# Patient Record
Sex: Female | Born: 1955
Health system: Southern US, Community
[De-identification: ages and names within clinical notes are randomized; demographics above are authoritative.]

## PROBLEM LIST (undated history)

## (undated) DIAGNOSIS — C801 Malignant (primary) neoplasm, unspecified: Secondary | ICD-10-CM

## (undated) DIAGNOSIS — M1990 Unspecified osteoarthritis, unspecified site: Secondary | ICD-10-CM

## (undated) DIAGNOSIS — Z111 Encounter for screening for respiratory tuberculosis: Secondary | ICD-10-CM

## (undated) DIAGNOSIS — M8580 Other specified disorders of bone density and structure, unspecified site: Secondary | ICD-10-CM

## (undated) HISTORY — DX: Other specified disorders of bone density and structure, unspecified site: M85.80

## (undated) HISTORY — DX: Unspecified osteoarthritis, unspecified site: M19.90

## (undated) HISTORY — PX: BREAST REDUCTION SURGERY: SHX8

## (undated) HISTORY — DX: Encounter for screening for respiratory tuberculosis: Z11.1

## (undated) HISTORY — DX: Malignant (primary) neoplasm, unspecified: C80.1

## (undated) HISTORY — PX: OTHER SURGICAL HISTORY: SHX169

---

## 1997-02-23 HISTORY — PX: COLONOSCOPY: SHX174

## 1998-05-08 ENCOUNTER — Encounter: Payer: Self-pay | Admitting: Internal Medicine

## 1998-10-21 ENCOUNTER — Other Ambulatory Visit: Admission: RE | Admit: 1998-10-21 | Discharge: 1998-10-21 | Payer: Self-pay | Admitting: Obstetrics and Gynecology

## 2000-02-06 ENCOUNTER — Other Ambulatory Visit: Admission: RE | Admit: 2000-02-06 | Discharge: 2000-02-06 | Payer: Self-pay | Admitting: *Deleted

## 2002-02-23 HISTORY — PX: REFRACTIVE SURGERY: SHX103

## 2002-04-24 ENCOUNTER — Encounter: Payer: Self-pay | Admitting: Internal Medicine

## 2002-05-03 ENCOUNTER — Other Ambulatory Visit: Admission: RE | Admit: 2002-05-03 | Discharge: 2002-05-03 | Payer: Self-pay | Admitting: Internal Medicine

## 2002-11-02 ENCOUNTER — Encounter: Admission: RE | Admit: 2002-11-02 | Discharge: 2002-11-02 | Payer: Self-pay | Admitting: Internal Medicine

## 2002-11-02 ENCOUNTER — Encounter: Payer: Self-pay | Admitting: Internal Medicine

## 2003-02-24 ENCOUNTER — Emergency Department (HOSPITAL_COMMUNITY): Admission: AD | Admit: 2003-02-24 | Discharge: 2003-02-24 | Payer: Self-pay | Admitting: Family Medicine

## 2004-11-06 ENCOUNTER — Encounter: Admission: RE | Admit: 2004-11-06 | Discharge: 2004-11-06 | Payer: Self-pay | Admitting: Internal Medicine

## 2005-03-04 ENCOUNTER — Other Ambulatory Visit: Admission: RE | Admit: 2005-03-04 | Discharge: 2005-03-04 | Payer: Self-pay | Admitting: Obstetrics and Gynecology

## 2005-04-10 ENCOUNTER — Encounter: Admission: RE | Admit: 2005-04-10 | Discharge: 2005-04-10 | Payer: Self-pay | Admitting: Obstetrics and Gynecology

## 2005-06-27 ENCOUNTER — Ambulatory Visit: Payer: Self-pay | Admitting: Family Medicine

## 2006-07-15 ENCOUNTER — Ambulatory Visit: Payer: Self-pay | Admitting: Family Medicine

## 2006-07-15 LAB — CONVERTED CEMR LAB
Bilirubin Urine: NEGATIVE
Ketones, ur: NEGATIVE mg/dL
Nitrite: NEGATIVE
Specific Gravity, Urine: >=1.03 (ref 1.000–1.03)
Urine Glucose: NEGATIVE mg/dL
Urobilinogen, UA: 0.2 (ref 0.0–1.0)

## 2006-07-22 ENCOUNTER — Ambulatory Visit: Payer: Self-pay | Admitting: Internal Medicine

## 2006-09-23 ENCOUNTER — Telehealth: Payer: Self-pay | Admitting: Internal Medicine

## 2006-10-21 ENCOUNTER — Encounter: Payer: Self-pay | Admitting: Internal Medicine

## 2006-10-28 ENCOUNTER — Encounter: Payer: Self-pay | Admitting: Internal Medicine

## 2006-11-04 ENCOUNTER — Ambulatory Visit: Payer: Self-pay | Admitting: Family Medicine

## 2006-11-04 ENCOUNTER — Encounter: Admission: RE | Admit: 2006-11-04 | Discharge: 2006-11-30 | Payer: Self-pay | Admitting: Neurological Surgery

## 2006-11-04 LAB — CONVERTED CEMR LAB
ALT: 21 units/L (ref 0–35)
AST: 25 units/L (ref 0–37)
Alkaline Phosphatase: 55 units/L (ref 39–117)
BUN: 13 mg/dL (ref 6–23)
Basophils Relative: 0.7 % (ref 0.0–1.0)
CO2: 30 meq/L (ref 19–32)
Calcium: 9.4 mg/dL (ref 8.4–10.5)
Chloride: 105 meq/L (ref 96–112)
Creatinine, Ser: 0.9 mg/dL (ref 0.4–1.2)
Crystals: NEGATIVE
Eosinophils Relative: 2.9 % (ref 0.0–5.0)
GFR calc Af Amer: 85 mL/min
HCT: 34.5 % — ABNORMAL LOW (ref 36.0–46.0)
HDL: 63.8 mg/dL (ref >39.0)
Lymphocytes Relative: 30.9 % (ref 12.0–46.0)
Mucus, UA: NEGATIVE
Neutrophils Relative %: 56.2 % (ref 43.0–77.0)
Platelets: 193 10*3/uL (ref 150–400)
RBC: 3.82 M/uL — ABNORMAL LOW (ref 3.87–5.11)
Total Bilirubin: 0.6 mg/dL (ref 0.3–1.2)
Total Protein, Urine: NEGATIVE mg/dL
Total Protein: 7 g/dL (ref 6.0–8.3)
VLDL: 12 mg/dL (ref 0–40)
WBC: 4.7 10*3/uL (ref 4.5–10.5)

## 2006-11-16 ENCOUNTER — Encounter: Payer: Self-pay | Admitting: Internal Medicine

## 2006-11-16 ENCOUNTER — Ambulatory Visit: Payer: Self-pay | Admitting: Internal Medicine

## 2006-11-16 ENCOUNTER — Other Ambulatory Visit: Admission: RE | Admit: 2006-11-16 | Discharge: 2006-11-16 | Payer: Self-pay | Admitting: Internal Medicine

## 2006-11-16 DIAGNOSIS — J309 Allergic rhinitis, unspecified: Secondary | ICD-10-CM | POA: Insufficient documentation

## 2006-11-16 DIAGNOSIS — M899 Disorder of bone, unspecified: Secondary | ICD-10-CM | POA: Insufficient documentation

## 2006-11-16 DIAGNOSIS — Z9189 Other specified personal risk factors, not elsewhere classified: Secondary | ICD-10-CM | POA: Insufficient documentation

## 2006-11-16 DIAGNOSIS — M1990 Unspecified osteoarthritis, unspecified site: Secondary | ICD-10-CM | POA: Insufficient documentation

## 2006-11-16 DIAGNOSIS — M949 Disorder of cartilage, unspecified: Secondary | ICD-10-CM

## 2006-11-22 ENCOUNTER — Ambulatory Visit: Payer: Self-pay | Admitting: Internal Medicine

## 2006-11-26 ENCOUNTER — Ambulatory Visit: Payer: Self-pay | Admitting: Internal Medicine

## 2006-11-26 ENCOUNTER — Encounter: Payer: Self-pay | Admitting: Internal Medicine

## 2006-11-28 ENCOUNTER — Encounter: Payer: Self-pay | Admitting: Internal Medicine

## 2006-11-29 ENCOUNTER — Encounter: Payer: Self-pay | Admitting: Internal Medicine

## 2007-01-03 ENCOUNTER — Telehealth: Payer: Self-pay | Admitting: Internal Medicine

## 2007-02-24 LAB — HM COLONOSCOPY

## 2007-03-14 ENCOUNTER — Encounter: Admission: RE | Admit: 2007-03-14 | Discharge: 2007-03-14 | Payer: Self-pay | Admitting: Internal Medicine

## 2007-08-10 ENCOUNTER — Telehealth: Payer: Self-pay | Admitting: Internal Medicine

## 2007-11-26 ENCOUNTER — Telehealth: Payer: Self-pay | Admitting: Family Medicine

## 2007-11-28 ENCOUNTER — Telehealth: Payer: Self-pay | Admitting: Internal Medicine

## 2009-03-22 ENCOUNTER — Telehealth: Payer: Self-pay | Admitting: Internal Medicine

## 2009-03-22 ENCOUNTER — Ambulatory Visit: Payer: Self-pay | Admitting: Internal Medicine

## 2009-03-22 DIAGNOSIS — R300 Dysuria: Secondary | ICD-10-CM | POA: Insufficient documentation

## 2009-03-22 LAB — CONVERTED CEMR LAB
Bilirubin Urine: NEGATIVE
Nitrite: NEGATIVE
Total Protein, Urine: NEGATIVE mg/dL
pH: 6.5 (ref 5.0–8.0)

## 2009-06-19 ENCOUNTER — Ambulatory Visit: Payer: Self-pay | Admitting: Internal Medicine

## 2009-06-19 LAB — CONVERTED CEMR LAB
AST: 22 units/L (ref 0–37)
Alkaline Phosphatase: 58 units/L (ref 39–117)
Basophils Absolute: 0 10*3/uL (ref 0.0–0.1)
CO2: 29 meq/L (ref 19–32)
Chloride: 109 meq/L (ref 96–112)
Creatinine, Ser: 0.8 mg/dL (ref 0.4–1.2)
HDL: 64.7 mg/dL (ref >39.00)
Hemoglobin: 11.7 g/dL — ABNORMAL LOW (ref 12.0–15.0)
LDL Cholesterol: 113 mg/dL — ABNORMAL HIGH (ref 0–99)
Lymphocytes Relative: 38.3 % (ref 12.0–46.0)
Monocytes Relative: 10.3 % (ref 3.0–12.0)
Platelets: 185 10*3/uL (ref 150.0–400.0)
RDW: 13.4 % (ref 11.5–14.6)
Total Bilirubin: 0.5 mg/dL (ref 0.3–1.2)
Total CHOL/HDL Ratio: 3
Triglycerides: 54 mg/dL (ref 0.0–149.0)

## 2009-06-26 ENCOUNTER — Ambulatory Visit: Payer: Self-pay | Admitting: Internal Medicine

## 2009-06-26 ENCOUNTER — Other Ambulatory Visit: Admission: RE | Admit: 2009-06-26 | Discharge: 2009-06-26 | Payer: Self-pay | Admitting: Internal Medicine

## 2009-06-26 DIAGNOSIS — E079 Disorder of thyroid, unspecified: Secondary | ICD-10-CM | POA: Insufficient documentation

## 2009-06-26 DIAGNOSIS — K219 Gastro-esophageal reflux disease without esophagitis: Secondary | ICD-10-CM | POA: Insufficient documentation

## 2009-06-26 DIAGNOSIS — D649 Anemia, unspecified: Secondary | ICD-10-CM | POA: Insufficient documentation

## 2009-06-26 LAB — HM PAP SMEAR

## 2009-07-17 ENCOUNTER — Ambulatory Visit: Payer: Self-pay | Admitting: Sports Medicine

## 2009-07-17 DIAGNOSIS — M25559 Pain in unspecified hip: Secondary | ICD-10-CM | POA: Insufficient documentation

## 2009-07-17 DIAGNOSIS — M25579 Pain in unspecified ankle and joints of unspecified foot: Secondary | ICD-10-CM | POA: Insufficient documentation

## 2009-07-17 DIAGNOSIS — M2170 Unequal limb length (acquired), unspecified site: Secondary | ICD-10-CM | POA: Insufficient documentation

## 2009-08-27 ENCOUNTER — Encounter (INDEPENDENT_AMBULATORY_CARE_PROVIDER_SITE_OTHER): Payer: Self-pay | Admitting: *Deleted

## 2010-01-06 ENCOUNTER — Ambulatory Visit: Payer: Self-pay | Admitting: Sports Medicine

## 2010-03-25 NOTE — Progress Notes (Signed)
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   [phone] Initial call taken by: Lynann Beaver CMA,  March 22, 2009 9:20 AM  Follow-up for Phone Call        Per Dr. Dava Najjar to put in order in EMR and have pt go to [facility]. Follow-up by: Romualdo Bolk, CMA (AAMA),  March 22, 2009 9:35 AM  New Problems: DYSURIA (ICD-788.1)   New Problems: DYSURIA (ICD-788.1) Orders sent and pt notified.

## 2010-03-25 NOTE — Assessment & Plan Note (Signed)
Summary: CPX // RS   Vital Signs:  Patient profile:   55 year old female Menstrual status:  postmenopausal Height:      66.5 inches Weight:      144 pounds BMI:     22.98 Pulse rate:   78 / minute BP sitting:   120 / 62  (right arm) Cuff size:   regular  Vitals Entered By: Romualdo Bolk, CMA (AAMA) (Jun 26, 2009 3:16 PM) CC: CPX with pap     Menstrual Status postmenopausal Last PAP Result historical   History of Present Illness: Peggy Harrington  comes in for a preventive visit .  Last visit was 2 years ago . She has done pretty well but recently  since had flu had pressure  fromupper  esophagus   area  and using   pepcid as needed.    ? some help.   Has had  Endo 2009   gastritis    mild   from  nsaids .  No reflux then and no strictures.  No bleeding.  other concerns.   Preventive Care Screening  Colonoscopy:    Date:  02/24/2007    Results:  normal   Mammogram:    Date:  02/24/2007    Results:  normal   Prior Values:    Pap Smear:  historical (04/24/2002)    Mammogram:  historical (05/24/2005)    Last Tetanus Booster:  historical (04/24/2002)   Preventive Screening-Counseling & Management  Alcohol-Tobacco     Alcohol drinks/day: <1     Alcohol type: beer     Smoking Status: never  Caffeine-Diet-Exercise     Caffeine use/day: 2     Does Patient Exercise: yes     Type of exercise: yoga and bike     Times/week: 2  Hep-HIV-STD-Contraception     Dental Visit-last 6 months yes     Sun Exposure-Excessive: no  Safety-Violence-Falls     Seat Belt Use: 100     Firearms in the Home: no firearms in the home     Smoke Detectors: yes      Blood Transfusions:  no and no tatoos.    Current Medications (verified): 1)  Calcium-Vitamin D 250-125 Mg-Unit Tabs (Calcium Carbonate-Vitamin D) 2)  Multivitamins   Caps (Multiple Vitamin) 3)  Ambien 10 Mg  Tabs (Zolpidem Tartrate) .... As Needed 4)  Vitamin D 400 Unit  Tabs (Cholecalciferol) .... 2 Three Times A  Week 5)  Advil 200 Mg  Caps (Ibuprofen) 6)  Pepcid 40 Mg Tabs (Famotidine) 7)  Coenzyme Q10 100 Mg Caps (Coenzyme Q10)  Allergies (verified): No Known Drug Allergies  Past History:  Past medical, surgical, family and social histories (including risk factors) reviewed, and no changes noted (except as noted below).  Past Medical History: Allergic rhinitis Osteoarthritis Osteopenia PPd neg at sceened    Past Surgical History: colonoscopy  1999 Lasik Surgery 2004 Endo 2009  Past History:  Care Management: Gastroenterology: Dr. Dionicio Stall  Family History: Reviewed history from 11/16/2006 and no changes required. 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         sacro  iliacpain on left r and  posterior   left ankle.  Physical Exam General Appearance: well developed, well nourished, no acute distress Eyes: conjunctiva and lids normal, PERRLA, EOMI, WNL  Ears, Nose, Mouth, Throat: TM clear, nares clear, oral exam WNL Neck: supple, no lymphadenopathy, no thyromegaly, no JVD Respiratory: clear to auscultation and percussion, respiratory effort normal Cardiovascular: regular  rate and rhythm, S1-S2, no murmur, rub or gallop, no bruits, peripheral pulses normal and symmetric, no cyanosis, clubbing, edema or varicosities Chest: no scars, masses, tenderness; no asymmetry, skin changes, nipple discharge   Gastrointestinal: soft, non-tender; no hepatosplenomegaly, masses; active bowel sounds all quadrants, guaiac negative stool; no masses, tenderness, hemorrhoids  Genitourinary: no vaginal discharge, lesions; no masses or tenderness PAP done  slight discomfort  Lymphatic: no cervical, axillary or inguinal adenopathy Musculoskeletal: gait normal, muscle tone and strength WNL, no joint swelling, effusions, discoloration, crepitus  laft ankle tender retrocalcaneal area  Skin: clear, good turgor, color WNL, no rashes, lesions, or ulcerations Neurologic: normal mental status, normal reflexes, normal strength, sensation, and motion Psychiatric: alert; oriented to person, place and time Other Exam:  EKG  sinus vs ectopic atrial  rate 59 nl intervals     Impression & Recommendations:  Problem # 1:  PREVENTIVE HEALTH CARE (ICD-V70.0)  counseled . continue   Orders: EKG w/ Interpretation (93000)  Problem # 2:  GYNECOLOGICAL EXAMINATION, ROUTINE (ICD-V72.31)  PAP done  Orders: Pap Smear, Thin Prep ( Collection of) (Z6109)  Problem # 3:  ANEMIA, MILD (ICD-285.9) but post menopausal  UTD on colonscopy  has Upper esophageal signs   no bleeding otherwise  Problem # 4:  THYROID STIMULATING HORMONE, ABNORMAL (ICD-246.9)  ULN uncleaer significane but would repeat in 6 months   Problem # 5:  INSOMNIA, HX OF (ICD-V15.89) refill med for ocassional use.  Problem # 6:  OSTEOPENIA (ICD-733.90)  Her updated medication list for this problem includes:    Calcium-vitamin D 250-125 Mg-unit Tabs (Calcium carbonate-vitamin d)    Vitamin D 400 Unit Tabs (Cholecalciferol) .Marland Kitchen... 2 three times a week  Problem # 7:  ESOPHAGEAL REFLUX (ICD-530.81) based on clinical picture   somewhat  later onset   and rec ppi and follow up if  persistent or  progressive   has hx of EGD in past for gastritis and no other abnormalities. Her updated medication list for this problem includes:    Pepcid 40 Mg Tabs (Famotidine)  Complete Medication List: 1)  Calcium-vitamin D 250-125 Mg-unit Tabs (Calcium carbonate-vitamin d) 2)  Multivitamins Caps (Multiple vitamin) 3)  Ambien 10 Mg Tabs (Zolpidem tartrate) .... As needed 4)  Vitamin D 400 Unit Tabs (Cholecalciferol) .... 2 three times a week 5)  Advil 200 Mg Caps (Ibuprofen) 6)  Pepcid 40 Mg Tabs (Famotidine) 7)  Coenzyme Q10 100 Mg Caps (Coenzyme q10)  Patient Instructions: 1)  Get  2 -3 week trial of ppi regarding  you chest poss esophageal signs .   2)  EKG is ok. 3)  Need TSH Free T4 thyroid antibodies  dx   246.9 4)  also  recheck hg in 1-2 months here or elsewhere .  call for order  Dx anemia if still low may plan on getting  IBC and b12 folate panel at that time .  Prescriptions: AMBIEN 10 MG  TABS (ZOLPIDEM TARTRATE) as needed  #30 x 1   Entered and Authorized by:   Madelin Headings MD   Signed by:   Madelin Headings MD on 06/26/2009   Method used:   Print then Give to Patient   RxID:   [id number]

## 2010-03-25 NOTE — Assessment & Plan Note (Signed)
Summary: NP,ACHILLES PAIN,MC   Vital Signs:  Patient profile:   55 year old female Menstrual status:  postmenopausal BP sitting:   116 / 71  Vitals Entered By: Lillia Pauls CMA (Jul 17, 2009 9:17 AM)  History of Present Illness: left lateral ankle has zinging sensation posterior to lateral mallelus  thinks related to pain in upper left buttocks feels with internal hip rotation, yoga, getting out of bed etc.  1 year ago noted buttocks pain after vigorous massage  sitting not particularly bad  no specific injury to hip but was doing a fair amount of yoga at time  toward end of day a bit more swelling behind left ankle no injuries to this ankle noted  primarily walks would like to do the women's only run    Allergies: No Known Drug Allergies  Physical Exam  General:  Well-developed,well-nourished,in no acute distress; alert,appropriate and cooperative throughout examination Msk:  faber tight on left hip flexion, rotation nl  good abduction strength  tight in left SI joint on pelvic rock very tight on pretzel stretch on left but not on RT not TTP at SIJ but just below this is tender over glut medius piriformis is non tender  left leg 1 cm short  Feet show transverse arch dec on RT and bilat some loss of long arch sublux 5th toe bilat  swelling just behind left lat malleolus but not tender good peroneal strength    Impression & Recommendations:  Problem # 1:  HIP PAIN, LEFT (ICD-719.45)  Her updated medication list for this problem includes:    Advil 200 Mg Caps (Ibuprofen)  suspect this was triggered by SIJ dysfxn from leg length  see isntructions  Problem # 2:  ANKLE PAIN, LEFT (ICD-719.47)  not a specific ankle injury  more stress to peroneals 2/2 leg length difference  Orders: Sports Insoles (L3510)  Problem # 3:  UNEQUAL LEG LENGTH (ICD-736.81)  try sports insole with heel lift on left  note her walking gait normalized with this in place with  hips and shoulders level  Orders: Sports Insoles (L3510)  Complete Medication List: 1)  Calcium-vitamin D 250-125 Mg-unit Tabs (Calcium carbonate-vitamin d) 2)  Multivitamins Caps (Multiple vitamin) 3)  Ambien 10 Mg Tabs (Zolpidem tartrate) .... As needed 4)  Vitamin D 400 Unit Tabs (Cholecalciferol) .... 2 three times a week 5)  Advil 200 Mg Caps (Ibuprofen) 6)  Pepcid 40 Mg Tabs (Famotidine) 7)  Coenzyme Q10 100 Mg Caps (Coenzyme q10)  Patient Instructions: 1)  pretzel strectch 3 x 30 sec  2)  pelvic tilt 5 x 10 sec  3)  hip rotation 3 x 30 sec  4)  cross leg stretch 3 x 30 secs 5)  also add some hip exercises on days you do not do yoga 6)  check out yoga with nancy 7)  2 mos reck your status

## 2010-03-25 NOTE — Miscellaneous (Signed)
Summary: MASSAGE RX  Clinical Lists Changes  Orders: Added new Referral order of Misc. Referral (Misc. Ref) - Signed

## 2010-03-25 NOTE — Assessment & Plan Note (Signed)
Summary: F/U SI PAIN AND ACHILLES   Vital Signs:  Patient profile:   55 year old female Menstrual status:  postmenopausal BP sitting:   114 / 60  Vitals Entered By: Lillia Pauls CMA (January 06, 2010 9:53 AM)  History of Present Illness: pt here today to f/u left SI pain as well as left achilles, which she says is about 90-95% better and rarely feels it at all. as for here SI pain, she feels it has gotten 10-15% worse, especially when she does alot of twisting. has been doin the stretches prescribed but not helping as much. 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 ran the womens only 5k without pain and hasnt experienced any pain during runs. Immediatly after yoga has some relief but pain does return by the next morning. Does not take NSAIDs 2o/2 gastritis.    Allergies: No Known Drug Allergies  Physical Exam  General:  Well-developed,well-nourished,in no acute distress; alert,appropriate and cooperative throughout examination Msk:  Left short leg.  L Anterior innominante rotation. HIP: TTP over L piriformis.  NO TTP over SI joint line or with sacral rocking.   ROM: WNL - 90o popliteal angles Strength: Abductors: 5-/5 B with L worse than R; Hip Extension 5-/5 B; ADDuctors & Flexion 5+/5  clear difference in left abductor and ext rotators of hip vs RT  Ankle: No swelling; mild TTP posterior to Lateral Mallelous.  Palpable snap over peroneals at Lateral mallelous while performing supine hamstring strength testing.    Extremities:  No clubbing, cyanosis, edema, or deformity noted with normal full range of motion of all joints.     Impression & Recommendations:  Problem # 1:  HIP PAIN, LEFT (ICD-719.45)  Her updated medication list for this problem includes:    Advil 200 Mg Caps (Ibuprofen)   will begin piriformis stretches and hip exercise series to strengthen left  reck 6 wks and expect a lot of strength gain  Problem  # 2:  UNEQUAL LEG LENGTH (ICD-736.81) given a few heel lifts to try can also purchase OTC one to help correct leg length difference  if running more may wind up needing a custom orthotic  Problem # 3:  ANKLE PAIN, LEFT (ICD-719.47) Assessment: Improved this has clinically resolved and will follow only if more pain  Complete Medication List: 1)  Calcium-vitamin D 250-125 Mg-unit Tabs (Calcium carbonate-vitamin d) 2)  Multivitamins Caps (Multiple vitamin) 3)  Ambien 10 Mg Tabs (Zolpidem tartrate) .... As needed 4)  Vitamin D 400 Unit Tabs (Cholecalciferol) .... 2 three times a week 5)  Advil 200 Mg Caps (Ibuprofen) 6)  Pepcid 40 Mg Tabs (Famotidine) 7)  Coenzyme Q10 100 Mg Caps (Coenzyme q10)   Orders Added: 1)  Est. Patient Level III [16109]

## 2010-03-25 NOTE — Progress Notes (Signed)
Summary: URGENT UA results.  Phone Note Call from Patient   Caller: Patient Call For: Peggy Headings MD Summary of Call: Pt wants to know her UA results ASAP as she states she is going to break the HIPPA rules and look it up in EMR? [phone] Initial call taken by: [facility] CMA,  March 22, 2009 1:31 PM  Follow-up for Phone Call        Let pt know that everything was neg. Culture pending. Follow-up by: Romualdo Bolk, CMA (AAMA),  March 22, 2009 1:32 PM  Additional Follow-up for Phone Call Additional follow up Details #1::        Pt notified. Additional Follow-up by: Lynann Beaver CMA,  March 22, 2009 1:45 PM

## 2010-12-23 ENCOUNTER — Encounter: Payer: Self-pay | Admitting: Internal Medicine

## 2010-12-23 ENCOUNTER — Ambulatory Visit (INDEPENDENT_AMBULATORY_CARE_PROVIDER_SITE_OTHER): Payer: 59 | Admitting: Internal Medicine

## 2010-12-23 DIAGNOSIS — E079 Disorder of thyroid, unspecified: Secondary | ICD-10-CM

## 2010-12-23 DIAGNOSIS — G479 Sleep disorder, unspecified: Secondary | ICD-10-CM

## 2010-12-23 DIAGNOSIS — D649 Anemia, unspecified: Secondary | ICD-10-CM

## 2010-12-23 DIAGNOSIS — Z9189 Other specified personal risk factors, not elsewhere classified: Secondary | ICD-10-CM

## 2010-12-23 LAB — CBC WITH DIFFERENTIAL/PLATELET
Basophils Absolute: 0 10*3/uL (ref 0.0–0.1)
Eosinophils Absolute: 0.1 10*3/uL (ref 0.0–0.7)
HCT: 35.9 % — ABNORMAL LOW (ref 36.0–46.0)
Lymphocytes Relative: 23.3 % (ref 12.0–46.0)
Lymphs Abs: 1.4 10*3/uL (ref 0.7–4.0)
Monocytes Relative: 10.4 % (ref 3.0–12.0)
Platelets: 200 10*3/uL (ref 150.0–400.0)
RDW: 13.3 % (ref 11.5–14.6)

## 2010-12-23 LAB — T4, FREE: Free T4: 0.8 ng/dL (ref 0.60–1.60)

## 2010-12-23 MED ORDER — ZOLPIDEM TARTRATE 10 MG PO TABS: 10.0000 mg | ORAL_TABLET | Freq: Every evening | ORAL | Status: DC | PRN

## 2010-12-23 NOTE — Patient Instructions (Addendum)
Will notify you  of labs when available. Can also try lunesta samples    Labs reviewed  And sent to pt via epic flag.  "Anemia better but still borderline . Unsure why . prob should check ibc and b12 folate and cbc in near future.  We can order this  As future order and do this when you can . "

## 2010-12-23 NOTE — Progress Notes (Signed)
  Subjective:    Patient ID: Peggy Harrington, female    DOB: [birth year], 55 y.o.   MRN: [id number]  HPI Pt comes in today for  Fu of sleep issues and other from 2011  Last visit : Undergoing   [facility]  training sig  stuational  stresses ; more recently    Awaken middle of night  .has used ambien prn in the past 5 mg o r less . Before  [employer] but not a lot.  Requests some help in this area . No sig etoh caffiene sleep hygiene no change  Generally healthy . NO major changes. Health.  Never got to come in for fu labs from last labs ( thyroid borderline and mild anemia) Non unusual bleeding  By hx  .  tob neg  etoh 5-6 per week caffiene in am only  ;does Yoga and walking.    Review of Systems NO fever bruising bleeding blood in stool cv pulm concerns about  depr anxiety except stress from  external issues   Has some orthho arthritis issues  runs   as above rst of ros neg or [place]      Past history family history social history reviewed in the electronic medical record.  Objective:   Physical Exam WDWN in nad   Looks well  Neck: Supple without adenopathy or masses or bruits  Palpable thyroid Chest:  Clear to A without wheezes rales or rhonchi CV:  S1-S2 no gallops or murmurs peripheral perfusion is normal Abdomen:  Sof,t normal bowel sounds without hepatosplenomegaly, no guarding rebound or masses no CVA tenderness Neuro psych grossly non focal  No tremor Oriented x 3 and no noted deficits in memory, attention, and speech. Psych normal affect.    Assessment & Plan:  Sleep disturbances  Situational   options discussed    Samples of lunesta 3 mg #7 given    May do better for sleep mainenance  But cause of cost Can try and rx for Remus Loffler can take 5 if needed.   Risk benefit of medication aware . Continue sleep hygiene measures  Abn borderline  thyroid : 18 months ago   Need repeat  Tests no obv sx . Borderline anemia :18 months ago repeat cbc today . No alarm hx .

## 2010-12-24 ENCOUNTER — Encounter: Payer: Self-pay | Admitting: Internal Medicine

## 2010-12-24 DIAGNOSIS — G479 Sleep disorder, unspecified: Secondary | ICD-10-CM | POA: Insufficient documentation

## 2010-12-24 LAB — THYROID PEROXIDASE ANTIBODY: Thyroperoxidase Ab SerPl-aCnc: <10 IU/mL (ref <35.0)

## 2010-12-26 NOTE — Progress Notes (Signed)
Addended byBerniece Andreas K on: 12/26/2010 01:36 PM   Modules accepted: Orders

## 2012-02-03 ENCOUNTER — Encounter: Payer: Self-pay | Admitting: *Deleted

## 2012-03-10 ENCOUNTER — Other Ambulatory Visit: Payer: Self-pay | Admitting: Obstetrics and Gynecology

## 2012-03-16 ENCOUNTER — Other Ambulatory Visit: Payer: Self-pay | Admitting: Obstetrics and Gynecology

## 2012-03-16 DIAGNOSIS — M8580 Other specified disorders of bone density and structure, unspecified site: Secondary | ICD-10-CM

## 2012-04-01 ENCOUNTER — Ambulatory Visit
Admission: RE | Admit: 2012-04-01 | Discharge: 2012-04-01 | Disposition: A | Discharge status: Home / Self Care | Payer: 59 | Source: Ambulatory Visit | Attending: Obstetrics and Gynecology | Admitting: Obstetrics and Gynecology

## 2012-04-01 DIAGNOSIS — M8580 Other specified disorders of bone density and structure, unspecified site: Secondary | ICD-10-CM

## 2012-04-09 ENCOUNTER — Other Ambulatory Visit: Payer: Self-pay

## 2012-05-31 ENCOUNTER — Telehealth: Payer: Self-pay | Admitting: Internal Medicine

## 2012-05-31 MED ORDER — PREDNISONE 20 MG PO TABS: ORAL_TABLET | ORAL | Status: DC

## 2012-05-31 NOTE — Telephone Encounter (Signed)
Spoke to the pt.  She denies fever.  Poison is underneath her eyelid and has some on her torso.  She is not going to fill the prescription unless she thinks she absolutely needs it.

## 2012-05-31 NOTE — Telephone Encounter (Signed)
Patient Information:  Caller Name: Nylia  Phone: [phone]  Patient: Peggy Harrington, Peggy Harrington  Gender: Female  DOB: 1955/08/24  Age: 57 Years  PCP: Berniece Andreas [facility])  Office Follow Up:  Does the office need to follow up with this patient?: Yes  Instructions For The Office: Refuses appt; requests Rx krs/can  RN Note:  Patient c/o facial poison ivy.  Denies emergent symptoms per protocol; advised and offered appt within 24 hours.  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  Uses [facility].  Info to office for provider review/Rx/callback.  May reach patient at [phone].  krs/can  Symptoms  Reason For Call & Symptoms: poison ivy/oak on face  Reviewed Health History In EMR: Yes  Reviewed Medications In EMR: Yes  Reviewed Allergies In EMR: Yes  Reviewed Surgeries / Procedures: Yes  Date of Onset of Symptoms: 05/30/2012  Guideline(s) Used:  Poison Ivy - Oak or [facility]  Disposition Per Guideline:   See Today in Office  Reason For Disposition Reached:   Face, eyes, lips or genitals are involved  Advice Given:  N/A  Patient Refused Recommendation:  Patient Requests Prescription  requests Rx; refuses appt krs/can

## 2012-06-01 ENCOUNTER — Other Ambulatory Visit: Payer: Self-pay | Admitting: Family Medicine

## 2012-06-01 MED ORDER — PREDNISONE 20 MG PO TABS: ORAL_TABLET | ORAL | Status: DC

## 2012-08-18 ENCOUNTER — Encounter: Payer: Self-pay | Admitting: Internal Medicine

## 2012-08-18 ENCOUNTER — Ambulatory Visit (INDEPENDENT_AMBULATORY_CARE_PROVIDER_SITE_OTHER): Payer: 59 | Admitting: Internal Medicine

## 2012-08-18 VITALS — BP 124/70 | HR 69 | Temp 98.1°F | Wt 144.0 lb

## 2012-08-18 DIAGNOSIS — Z0000 Encounter for general adult medical examination without abnormal findings: Secondary | ICD-10-CM

## 2012-08-18 DIAGNOSIS — Z23 Encounter for immunization: Secondary | ICD-10-CM

## 2012-08-18 DIAGNOSIS — Z79899 Other long term (current) drug therapy: Secondary | ICD-10-CM

## 2012-08-18 DIAGNOSIS — G4700 Insomnia, unspecified: Secondary | ICD-10-CM

## 2012-08-18 DIAGNOSIS — Z9189 Other specified personal risk factors, not elsewhere classified: Secondary | ICD-10-CM

## 2012-08-18 LAB — CBC WITH DIFFERENTIAL/PLATELET
Basophils Relative: 0.6 % (ref 0.0–3.0)
Eosinophils Relative: 2 % (ref 0.0–5.0)
HCT: 36.3 % (ref 36.0–46.0)
Hemoglobin: 12.1 g/dL (ref 12.0–15.0)
Lymphs Abs: 1.4 10*3/uL (ref 0.7–4.0)
Monocytes Relative: 11.1 % (ref 3.0–12.0)
Neutro Abs: 3.8 10*3/uL (ref 1.4–7.7)
RBC: 3.91 Mil/uL (ref 3.87–5.11)
WBC: 6 10*3/uL (ref 4.5–10.5)

## 2012-08-18 LAB — LIPID PANEL
Cholesterol: 213 mg/dL — ABNORMAL HIGH (ref 0–200)
Total CHOL/HDL Ratio: 3

## 2012-08-18 LAB — HEPATIC FUNCTION PANEL
ALT: 19 U/L (ref 0–35)
AST: 24 U/L (ref 0–37)
Albumin: 4.1 g/dL (ref 3.5–5.2)
Total Bilirubin: 0.5 mg/dL (ref 0.3–1.2)
Total Protein: 7 g/dL (ref 6.0–8.3)

## 2012-08-18 LAB — BASIC METABOLIC PANEL
Chloride: 103 mEq/L (ref 96–112)
GFR: 85.86 mL/min (ref >60.00)
Potassium: 4.3 mEq/L (ref 3.5–5.1)
Sodium: 139 mEq/L (ref 135–145)

## 2012-08-18 MED ORDER — ZOLPIDEM TARTRATE 10 MG PO TABS: ORAL_TABLET | ORAL | Status: DC

## 2012-08-18 NOTE — Progress Notes (Signed)
Chief Complaint  Patient presents with  . Follow-up    Needs refills of Ambien    HPI:  Last ov 10 12  Since that time she has seen her OB/GYN and had a normal Pap exam. She has her refills and Ambien only taking about a third of a 10 mg occasional 60 pills is lacerated her year. Doesn't tend to take it more than every 4 days continuously. Have better sleep hygiene tends to stay up late somewhat. Takes Pepcid as needed when she is to take ibuprofen. No major injury hospitalization emergency room evaluation. Uncertain when she had her last T. It could be 10 years. Net tob caffiene 2 c no sig etoh exercises some ROS: See pertinent positives and negatives per HPI. No cardiovascular pulmonary GI GU symptoms of import  Past Medical History  Diagnosis Date  . Allergic rhinitis   . Osteoarthritis   . Osteopenia   . PPD screening test     negative     Family History  Problem Relation Age of Onset  . Hypertension Mother   . Goiter Sister   . Cancer Other     colon  . Coronary artery disease Other     History   Social History  . Marital Status: Single    Spouse Name: N/A    Number of Children: N/A  . Years of Education: N/A   Social History Main Topics  . Smoking status: Never Smoker   . Smokeless tobacco: None  . Alcohol Use: None  . Drug Use: None  . Sexually Active: None   Other Topics Concern  . None   Social History Narrative   hh  Of 1    Pet cats   No ets   Employed: PA    Outpatient Encounter Prescriptions as of 08/18/2012  Medication Sig Dispense Refill  . Cholecalciferol (VITAMIN D) 400 UNITS capsule Take 400 Units by mouth daily.        . famotidine (PEPCID) 40 MG tablet Take 40 mg by mouth daily as needed.       Marland Kitchen ibuprofen (ADVIL,MOTRIN) 200 MG tablet Take 200 mg by mouth every 6 (six) hours as needed.        . zolpidem (AMBIEN) 10 MG tablet 1/2 to 1 as needed for sleep  30 tablet  1  . [DISCONTINUED] predniSONE (DELTASONE) 20 MG tablet  3,3,3,2,2,2,1,1,1,1/2,1/2,1/2  20 tablet  0  . [DISCONTINUED] zolpidem (AMBIEN) 10 MG tablet Take 1 tablet (10 mg total) by mouth at bedtime as needed for sleep.  30 tablet  0   No facility-administered encounter medications on file as of 08/18/2012.    EXAM:  BP 124/70  Pulse 69  Temp(Src) 98.1 F (36.7 C) (Oral)  Wt 144 lb (65.318 kg)  BMI 22.9 kg/m2  SpO2 98%  Body mass index is 22.9 kg/(m^2).  GENERAL: vitals reviewed and listed above, alert, oriented, appears well hydrated and in no acute distress  HEENT: atraumatic, conjunctiva  clear, no obvious abnormalities on inspection of external nose and ears OP : no lesion edema or exudate   NECK: no obvious masses on inspection palpation thyroid may be palpable but no nodules are noted  LUNGS: clear to auscultation bilaterally, no wheezes, rales or rhonchi, good air movement CV: HRRR, no clubbing cyanosis or  peripheral edema nl cap refill  Abdomen soft without megaly guarding or rebound MS: moves all extremities without noticeable focal  abnormality PSYCH: pleasant and cooperative, no obvious depression or anxiety  Lab Results  Component Value Date   WBC 5.8 12/23/2010   HGB 12.2 12/23/2010   HCT 35.9* 12/23/2010   PLT 200.0 12/23/2010   GLUCOSE 86 06/19/2009   CHOL 188 06/19/2009   TRIG 54.0 06/19/2009   HDL 64.70 06/19/2009   LDLDIRECT 130.9 11/04/2006   LDLCALC 113* 06/19/2009   ALT 19 06/19/2009   AST 22 06/19/2009   NA 145 06/19/2009   K 4.5 06/19/2009   CL 109 06/19/2009   CREATININE 0.8 06/19/2009   BUN 14 06/19/2009   CO2 29 06/19/2009   TSH 2.02 12/23/2010    ASSESSMENT AND PLAN:  Discussed the following assessment and plan:  Insomnia  INSOMNIA, HX OF - Plan: Basic metabolic panel, CBC with Differential, Hepatic function panel, Lipid panel, TSH  Preventative health care - screening labs today  - Plan: Basic metabolic panel, CBC with Differential, Hepatic function panel, Lipid panel, TSH  Medication  management  Need for prophylactic vaccination with combined diphtheria-tetanus-pertussis (DTP) vaccine - Plan: Tdap vaccine greater than or equal to 7yo IM  Risk benefit of medication discussed. Preventive measure reviewed also  -Patient advised to return or notify health care team  if symptoms worsen or persist or new concerns arise.  Patient Instructions  Continue lifestyle intervention healthy eating and exercise . Trial get to bed earlier. Will notify you  of labs when available. Sign up for mychart . tdap today.  Yearly med check as needed. Limit med to avoid dependency for sleep.     Neta Mends. Panosh M.D.

## 2012-08-18 NOTE — Patient Instructions (Signed)
Continue lifestyle intervention healthy eating and exercise . Trial get to bed earlier. Will notify you  of labs when available. Sign up for mychart . tdap today.  Yearly med check as needed. Limit med to avoid dependency for sleep.

## 2012-12-29 ENCOUNTER — Other Ambulatory Visit: Payer: Self-pay

## 2013-02-23 HISTORY — PX: BREAST REDUCTION SURGERY: SHX8

## 2013-03-29 ENCOUNTER — Other Ambulatory Visit: Payer: Self-pay

## 2013-03-29 ENCOUNTER — Other Ambulatory Visit: Payer: Self-pay | Admitting: Internal Medicine

## 2013-03-29 DIAGNOSIS — Z1231 Encounter for screening mammogram for malignant neoplasm of breast: Secondary | ICD-10-CM

## 2013-04-26 ENCOUNTER — Ambulatory Visit
Admission: RE | Admit: 2013-04-26 | Discharge: 2013-04-26 | Disposition: A | Discharge status: Home / Self Care | Payer: 59 | Source: Ambulatory Visit

## 2013-04-26 DIAGNOSIS — Z1231 Encounter for screening mammogram for malignant neoplasm of breast: Secondary | ICD-10-CM

## 2013-07-12 ENCOUNTER — Other Ambulatory Visit: Payer: Self-pay

## 2013-09-29 ENCOUNTER — Encounter: Payer: Self-pay | Admitting: *Deleted

## 2013-10-20 ENCOUNTER — Encounter: Payer: Self-pay | Admitting: Internal Medicine

## 2014-04-25 ENCOUNTER — Encounter: Payer: Self-pay | Admitting: Internal Medicine

## 2015-01-31 ENCOUNTER — Telehealth: Payer: Self-pay | Admitting: *Deleted

## 2015-01-31 ENCOUNTER — Encounter: Payer: Self-pay | Admitting: *Deleted

## 2015-01-31 DIAGNOSIS — Z1211 Encounter for screening for malignant neoplasm of colon: Secondary | ICD-10-CM

## 2015-01-31 NOTE — Telephone Encounter (Signed)
Patient has scheduled direct colonoscopy for screening. Instructions and prep have been given to patient.

## 2015-04-01 ENCOUNTER — Encounter: Payer: Self-pay | Admitting: Internal Medicine

## 2015-04-01 ENCOUNTER — Ambulatory Visit (AMBULATORY_SURGERY_CENTER): Payer: 59 | Admitting: Internal Medicine

## 2015-04-01 VITALS — BP 110/60 | HR 65 | Temp 97.5°F | Resp 22 | Ht 67.0 in | Wt 144.0 lb

## 2015-04-01 DIAGNOSIS — Z1211 Encounter for screening for malignant neoplasm of colon: Secondary | ICD-10-CM | POA: Diagnosis not present

## 2015-04-01 DIAGNOSIS — K635 Polyp of colon: Secondary | ICD-10-CM

## 2015-04-01 DIAGNOSIS — D122 Benign neoplasm of ascending colon: Secondary | ICD-10-CM | POA: Diagnosis not present

## 2015-04-01 DIAGNOSIS — M1990 Unspecified osteoarthritis, unspecified site: Secondary | ICD-10-CM | POA: Diagnosis not present

## 2015-04-01 DIAGNOSIS — D123 Benign neoplasm of transverse colon: Secondary | ICD-10-CM | POA: Diagnosis not present

## 2015-04-01 MED ORDER — SODIUM CHLORIDE 0.9 % IV SOLN: 500.0000 mL | INTRAVENOUS | Status: DC

## 2015-04-01 NOTE — Progress Notes (Signed)
A/ox3, pleased with MAC, report to RN 

## 2015-04-01 NOTE — Op Note (Signed)
[facility]  [employer]. [place], [address]   COLONOSCOPY PROCEDURE REPORT  PATIENT: Peggy Harrington, Peggy Harrington  MR#: [id number] BIRTHDATE: 12/29/1955 , 57  yrs. old GENDER: female ENDOSCOPIST: Jerene Bears, MD PROCEDURE DATE:  04/01/2015 PROCEDURE:   Colonoscopy, screening and Colonoscopy with snare polypectomy First Screening Colonoscopy - Avg.  risk and is 50 yrs.  old or older - No.  Prior Negative Screening - Now for repeat screening. Less than 10 yrs Prior Negative Screening - Now for repeat screening.  Above average risk Prior Negative Screening - Now for repeat screening.  Less than 10 yrs  History of Adenoma - Now for follow-up colonoscopy & has been > or = to 3 yrs.  N/A  Polyps removed today? Yes ASA CLASS:   Class II INDICATIONS:Screening for colonic neoplasia, FH Colon or Rectal Adenocarcinoma, and FH Colon Adenoma. MEDICATIONS: Monitored anesthesia care and Propofol 350 mg IV  DESCRIPTION OF PROCEDURE:   After the risks benefits and alternatives of the procedure were thoroughly explained, informed consent was obtained.  The digital rectal exam revealed no rectal mass.   The LB [id number]  endoscope was introduced through the anus and advanced to the terminal ileum which was intubated for a short distance. No adverse events experienced.   The quality of the prep was excellent.  (Suprep was used)  The instrument was then slowly withdrawn as the colon was fully examined. Estimated blood loss is zero unless otherwise noted in this procedure report.   COLON FINDINGS: The examined terminal ileum appeared to be normal. Three sessile polyps ranging between 5-34mm in size with mucous caps were found in the ascending colon (1) and at the hepatic flexure (2).  Polypectomies were performed with a cold snare.  The resection was complete, the polyp tissue was completely retrieved and sent to histology.   There was mild diverticulosis noted in the sigmoid colon.   Retroflexed views revealed no abnormalities. The time to cecum = 2.0 Withdrawal time = 16.8   The scope was withdrawn and the procedure completed.  COMPLICATIONS: There were no immediate complications.     ENDOSCOPIC IMPRESSION: 1.   The examined terminal ileum appeared to be normal 2.   Three sessile polyps ranging between 5-88mm in size were found in the ascending colon and at the hepatic flexure; polypectomies were performed with a cold snare 3.   Mild diverticulosis was noted in the sigmoid colon  RECOMMENDATIONS: 1.  Await pathology results 2.  Avoid all NSAIDs for the next 2 weeks. 3.  Timing of repeat colonoscopy will be determined by pathology findings. 4.  You will receive a letter within 1-2 weeks with the results of your biopsy as well as final recommendations.  Please call my office if you have not received a letter after 3 weeks.  eSigned:  Jerene Bears, MD 04/01/2015 3:49 PM   cc:  the patient, Dr. Regis Bill   PATIENT NAME:  Peggy Harrington, Peggy Harrington MR#: [id number]

## 2015-04-01 NOTE — Progress Notes (Signed)
Dr. Hilarie Fredrickson stated that it was fine for [place] to take [place] to Amy's office on the 3rd floor. Amy is driving [place] home.

## 2015-04-01 NOTE — Progress Notes (Signed)
Called to room to assist during endoscopic procedure.  Patient ID and intended procedure confirmed with present staff. Received instructions for my participation in the procedure from the performing physician.  

## 2015-04-01 NOTE — Patient Instructions (Signed)
YOU HAD AN ENDOSCOPIC PROCEDURE TODAY AT [facility]:   Refer to the procedure report that was given to you for any specific questions about what was found during the examination.  If the procedure report does not answer your questions, please call your gastroenterologist to clarify.  If you requested that your care partner not be given the details of your procedure findings, then the procedure report has been included in a sealed envelope for you to review at your convenience later.  YOU SHOULD EXPECT: Some feelings of bloating in the abdomen. Passage of more gas than usual.  Walking can help get rid of the air that was put into your GI tract during the procedure and reduce the bloating. If you had a lower endoscopy (such as a colonoscopy or flexible sigmoidoscopy) you may notice spotting of blood in your stool or on the toilet paper. If you underwent a bowel prep for your procedure, you may not have a normal bowel movement for a few days.  Please Note:  You might notice some irritation and congestion in your nose or some drainage.  This is from the oxygen used during your procedure.  There is no need for concern and it should clear up in a day or so.  SYMPTOMS TO REPORT IMMEDIATELY:   Following lower endoscopy (colonoscopy or flexible sigmoidoscopy):  Excessive amounts of blood in the stool  Significant tenderness or worsening of abdominal pains  Swelling of the abdomen that is new, acute  Fever of 100F or higher   For urgent or emergent issues, a gastroenterologist can be reached at any hour by calling [phone].   DIET: Your first meal following the procedure should be a small meal and then it is ok to progress to your normal diet. Heavy or fried foods are harder to digest and may make you feel nauseous or bloated.  Likewise, meals heavy in dairy and vegetables can increase bloating.  Drink plenty of fluids but you should avoid alcoholic beverages for 24 hours.  Try to  increase the fiber in your diet.  ACTIVITY:  You should plan to take it easy for the rest of today and you should NOT DRIVE or use heavy machinery until tomorrow (because of the sedation medicines used during the test).    FOLLOW UP: Our staff will call the number listed on your records the next business day following your procedure to check on you and address any questions or concerns that you may have regarding the information given to you following your procedure. If we do not reach you, we will leave a message.  However, if you are feeling well and you are not experiencing any problems, there is no need to return our call.  We will assume that you have returned to your regular daily activities without incident.  If any biopsies were taken you will be contacted by phone or by letter within the next 1-3 weeks.  Please call us at [phone] if you have not heard about the biopsies in 3 weeks.    SIGNATURES/CONFIDENTIALITY: You and/or your care partner have signed paperwork which will be entered into your electronic medical record.  These signatures attest to the fact that that the information above on your After Visit Summary has been reviewed and is understood.  Full responsibility of the confidentiality of this discharge information lies with you and/or your care-partner.  Avoid all NSAIDS for the next two weeks.

## 2015-04-02 ENCOUNTER — Telehealth: Payer: Self-pay | Admitting: *Deleted

## 2015-04-02 NOTE — Telephone Encounter (Signed)
  Follow up Call-  Call back number 04/01/2015  Post procedure Call Back phone  # [phone]  Permission to leave phone message Yes     Patient questions:  Message left to all [place] if necessary.

## 2015-04-04 ENCOUNTER — Encounter: Payer: Self-pay | Admitting: Internal Medicine

## 2015-04-10 ENCOUNTER — Telehealth: Payer: 59 | Admitting: Family

## 2015-04-10 DIAGNOSIS — L255 Unspecified contact dermatitis due to plants, except food: Secondary | ICD-10-CM | POA: Diagnosis not present

## 2015-04-10 MED ORDER — PREDNISONE 5 MG (21) PO TBPK: 5.0000 mg | ORAL_TABLET | ORAL | Status: DC

## 2015-04-10 NOTE — Progress Notes (Signed)

## 2015-04-11 MED FILL — predniSONE 5 MG (21) TBPK: 5 | 6 days supply | Qty: 21 | Fill #0

## 2015-08-29 DIAGNOSIS — M7502 Adhesive capsulitis of left shoulder: Secondary | ICD-10-CM | POA: Diagnosis not present

## 2015-09-12 DIAGNOSIS — M7502 Adhesive capsulitis of left shoulder: Secondary | ICD-10-CM | POA: Diagnosis not present

## 2015-09-12 DIAGNOSIS — M25512 Pain in left shoulder: Secondary | ICD-10-CM | POA: Diagnosis not present

## 2015-09-12 DIAGNOSIS — M25612 Stiffness of left shoulder, not elsewhere classified: Secondary | ICD-10-CM | POA: Diagnosis not present

## 2015-09-16 DIAGNOSIS — M25512 Pain in left shoulder: Secondary | ICD-10-CM | POA: Diagnosis not present

## 2015-09-16 DIAGNOSIS — M25612 Stiffness of left shoulder, not elsewhere classified: Secondary | ICD-10-CM | POA: Diagnosis not present

## 2015-09-16 DIAGNOSIS — M7502 Adhesive capsulitis of left shoulder: Secondary | ICD-10-CM | POA: Diagnosis not present

## 2015-09-23 DIAGNOSIS — M7502 Adhesive capsulitis of left shoulder: Secondary | ICD-10-CM | POA: Diagnosis not present

## 2015-09-23 DIAGNOSIS — M25612 Stiffness of left shoulder, not elsewhere classified: Secondary | ICD-10-CM | POA: Diagnosis not present

## 2015-09-23 DIAGNOSIS — M25512 Pain in left shoulder: Secondary | ICD-10-CM | POA: Diagnosis not present

## 2015-12-25 ENCOUNTER — Telehealth: Payer: Self-pay | Admitting: Internal Medicine

## 2015-12-25 NOTE — Telephone Encounter (Signed)
Ok to add her on to the Thursday November 30 for cpx

## 2015-12-25 NOTE — Telephone Encounter (Signed)
Pt would like to have her CPE before the end of the year and would like to have it on one of these days (Nov. 30 or Dec 1)  if Dr. Regis Bill will allow her.

## 2015-12-26 NOTE — Telephone Encounter (Signed)
Pt scheduled  

## 2016-01-22 NOTE — Progress Notes (Signed)
Pre visit review using our clinic review tool, if applicable. No additional management support is needed unless otherwise documented below in the visit note.  Chief Complaint  Patient presents with  . Annual Exam    HPI: Patient  Peggy Harrington  60 y.o. comes in today for Preventive Health Care visit  No major changes in health. Pap utd per Peggy Harrington  Health Maintenance  Topic Date Due  . Hepatitis C Screening  01-21-1956  . PAP SMEAR  03/11/2015  . MAMMOGRAM  04/27/2015  . ZOSTAVAX  01/21/2017 (Originally 04/02/2015)  . HIV Screening  01/21/2017 (Originally 04/01/1970)  . COLONOSCOPY  03/31/2018  . TETANUS/TDAP  08/19/2022  . 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 GI /GU: No adominal pain, vomiting, change in bowel habits. No blood in the stool. No significant GU symptoms. SKIN/HEME: ,no acute skin rashes suspicious lesions or bleeding. No lymphadenopathy, nodules, masses.  NEURO/ PSYCH:  No neurologic signs such as weakness numbness. No depression anxiety. IMM/ Allergy: No unusual infections.  Allergy .   REST of 12 system review negative except as per HPI   Past Medical History:  Diagnosis Date  . Allergic rhinitis   . Osteoarthritis   . Osteopenia   . PPD screening test    negative     Past Surgical History:  Procedure Laterality Date  . BREAST REDUCTION SURGERY    . BREAST REDUCTION SURGERY  2015  . COLONOSCOPY  1999  . REFRACTIVE SURGERY  [id number]   Bro alcoholic CM  One MI 67  Family History  Problem Relation Age of Onset  . Hypertension  Mother   . Colon cancer Mother   . Goiter Sister   . Colon polyps Sister   . Coronary artery disease Other   . Colon polyps Brother   . Coronary artery disease Brother     Social History   Social History  . Marital status: Single    Spouse name: N/A  . Number of children: N/A  . Years of education: N/A   Social History Main Topics  . Smoking status: Never Smoker  . Smokeless tobacco: Never Used  . Alcohol use 3.0 oz/week    5 Cans of beer per week  . Drug use: No  . Sexual activity: Not Asked   Other Topics Concern  . None   Social History Narrative   hh  Of 1    Pet cats   No ets   Employed: [employer]    Outpatient Medications Prior to Visit  Medication Sig Dispense Refill  . ibuprofen (ADVIL,MOTRIN) 200 MG tablet Take 200 mg by mouth every 6 (six) hours as needed. Reported on 04/01/2015    . zolpidem (AMBIEN) 10 MG tablet 1/2 to 1 as needed for sleep 30 tablet 1  . famotidine (PEPCID) 40 MG tablet Take 40 mg by mouth daily as needed. Reported on 04/01/2015    . predniSONE (STERAPRED UNI-PAK 21 TAB) 5 MG (21) TBPK tablet Take 1 tablet (5 mg total) by mouth as directed. Per pharmacist for Dose Pack 21  tablet 0   No facility-administered medications prior to visit.      EXAM:  BP 124/80 (BP Location: Left Arm, Patient Position: Sitting, Cuff Size: Normal)   Temp 97.9 F (36.6 C) (Oral)   Ht 5' 6.25" (1.683 m)   Wt 149 lb 6.4 oz (67.8 kg)   BMI 23.93 kg/m   Body mass index is 23.93 kg/m.  Physical Exam: Vital signs reviewed [id number] is a well-developed well-nourished alert cooperative    who appearsr stated age in no acute distress.  HEENT: normocephalic atraumatic , Eyes: PERRL EOM's full, conjunctiva clear, Nares: paten,t no deformity discharge or tenderness., Ears: no deformity EAC's clear TMs with normal landmarks. eac  Canal indent inferior wall but no redness   Dc  Mouth: clear OP, no lesions, edema.  Moist mucous membranes. Dentition in adequate repair. NECK:  supple without masses, thyromegaly or bruits. CHEST/PULM:  Clear to auscultation and percussion breath sounds equal no wheeze , rales or rhonchi. No chest wall deformities or tenderness.Breast: normal by inspection . No dimpling, discharge, masses, tenderness or discharge . Well healed scars  CV: PMI is nondisplaced, S1 S2 no gallops, murmurs, rubs. Peripheral pulses are full without delay.No JVD .  ABDOMEN: Bowel sounds normal nontender  No guard or rebound, no hepato splenomegal no CVA tenderness.  No hernia. Extremtities:  No clubbing cyanosis or edema, no acute joint swelling or redness no focal atrophy NEURO:  Oriented x3, cranial nerves 3-12 appear to be intact, no obvious focal weakness,gait within normal limits no abnormal reflexes or asymmetrical SKIN: No acute rashes normal turgor, color, no bruising or petechiae. Skin tags  Sun freckling  PSYCH: Oriented, good eye contact, no obvious depression anxiety, cognition and judgment appear normal. LN: no cervical axillary inguinal adenopathy    ASSESSMENT AND PLAN:  Discussed the following assessment and plan:  Visit for preventive health examination - Plan: Basic metabolic panel, CBC with Differential/Platelet, Hepatic function panel, Lipid panel, TSH  Need for hepatitis C screening test - Plan: Hepatitis C antibody  Other problems related to lifestyle - Plan: Hepatitis C antibody Counseled.  Health care  Management   Bone health  Consider new shingles vaccine when available . Get mammogram  Patient Care Team: Peggy Medin, MD as PCP - General Peggy Charleston, MD as Attending Physician (Obstetrics and Gynecology) Patient Instructions  Continue lifestyle intervention healthy eating and exercise . caclim vit d   get  Your mammogram.  New shingles vaccine when available   Will notify you  of labs when available.  cpx wellness  In 1-2 years if all ok.    Peggy Harrington. Peggy Harrington M.D.

## 2016-01-23 ENCOUNTER — Ambulatory Visit (INDEPENDENT_AMBULATORY_CARE_PROVIDER_SITE_OTHER): Payer: 59 | Admitting: Internal Medicine

## 2016-01-23 ENCOUNTER — Encounter: Payer: Self-pay | Admitting: Internal Medicine

## 2016-01-23 VITALS — BP 124/80 | Temp 97.9°F | Ht 66.25 in | Wt 149.4 lb

## 2016-01-23 DIAGNOSIS — Z7289 Other problems related to lifestyle: Secondary | ICD-10-CM

## 2016-01-23 DIAGNOSIS — Z1159 Encounter for screening for other viral diseases: Secondary | ICD-10-CM

## 2016-01-23 DIAGNOSIS — Z0000 Encounter for general adult medical examination without abnormal findings: Secondary | ICD-10-CM | POA: Diagnosis not present

## 2016-01-23 LAB — HEPATIC FUNCTION PANEL
ALBUMIN: 4.1 g/dL (ref 3.5–5.2)
ALK PHOS: 53 U/L (ref 39–117)
ALT: 13 U/L (ref 0–35)
AST: 16 U/L (ref 0–37)
BILIRUBIN DIRECT: 0.1 mg/dL (ref 0.0–0.3)
BILIRUBIN TOTAL: 0.4 mg/dL (ref 0.2–1.2)
Total Protein: 6.4 g/dL (ref 6.0–8.3)

## 2016-01-23 LAB — CBC WITH DIFFERENTIAL/PLATELET
BASOS ABS: 0 10*3/uL (ref 0.0–0.1)
Basophils Relative: 0.6 % (ref 0.0–3.0)
EOS ABS: 0.1 10*3/uL (ref 0.0–0.7)
Eosinophils Relative: 1.7 % (ref 0.0–5.0)
HCT: 35.9 % — ABNORMAL LOW (ref 36.0–46.0)
Hemoglobin: 12.1 g/dL (ref 12.0–15.0)
LYMPHS ABS: 1.4 10*3/uL (ref 0.7–4.0)
Lymphocytes Relative: 26.1 % (ref 12.0–46.0)
MCHC: 33.7 g/dL (ref 30.0–36.0)
MCV: 92.8 fl (ref 78.0–100.0)
MONO ABS: 0.4 10*3/uL (ref 0.1–1.0)
MONOS PCT: 7.9 % (ref 3.0–12.0)
NEUTROS ABS: 3.4 10*3/uL (ref 1.4–7.7)
NEUTROS PCT: 63.7 % (ref 43.0–77.0)
PLATELETS: 233 10*3/uL (ref 150.0–400.0)
RBC: 3.87 Mil/uL (ref 3.87–5.11)
RDW: 14.3 % (ref 11.5–15.5)
WBC: 5.4 10*3/uL (ref 4.0–10.5)

## 2016-01-23 LAB — BASIC METABOLIC PANEL
BUN: 12 mg/dL (ref 6–23)
CALCIUM: 9.5 mg/dL (ref 8.4–10.5)
CO2: 28 meq/L (ref 19–32)
CREATININE: 0.92 mg/dL (ref 0.40–1.20)
Chloride: 106 mEq/L (ref 96–112)
GFR: 66 mL/min (ref >60.00)
GLUCOSE: 89 mg/dL (ref 70–99)
Potassium: 4.4 mEq/L (ref 3.5–5.1)
SODIUM: 141 meq/L (ref 135–145)

## 2016-01-23 LAB — LIPID PANEL
Cholesterol: 212 mg/dL — ABNORMAL HIGH (ref 0–200)
HDL: 77.8 mg/dL (ref >39.00)
LDL CALC: 115 mg/dL — AB (ref 0–99)
NONHDL: 134.64
Total CHOL/HDL Ratio: 3
Triglycerides: 97 mg/dL (ref 0.0–149.0)
VLDL: 19.4 mg/dL (ref 0.0–40.0)

## 2016-01-23 LAB — TSH: TSH: 3.12 u[IU]/mL (ref 0.35–4.50)

## 2016-01-23 NOTE — Patient Instructions (Signed)
Continue lifestyle intervention healthy eating and exercise . caclim vit d   get  Your mammogram.  New shingles vaccine when available   Will notify you  of labs when available.  cpx wellness  In 1-2 years if all ok.

## 2016-01-24 DIAGNOSIS — M7502 Adhesive capsulitis of left shoulder: Secondary | ICD-10-CM | POA: Diagnosis not present

## 2016-01-24 LAB — HEPATITIS C ANTIBODY: HCV Ab: NEGATIVE

## 2016-01-31 DIAGNOSIS — M7502 Adhesive capsulitis of left shoulder: Secondary | ICD-10-CM | POA: Diagnosis not present

## 2016-11-13 ENCOUNTER — Encounter: Payer: Self-pay | Admitting: Internal Medicine

## 2017-04-14 MED FILL — PENICILLIN VK 500 MG TABLET: 500 | 7 days supply | Qty: 30 | Fill #0

## 2017-06-29 NOTE — Progress Notes (Signed)
Chief Complaint  Patient presents with  . Annual Exam    No new concerns. Pt did have an episode of ankle edema after being sedentary for a few days, this has resolved.     HPI: Patient  Peggy Harrington  62 y.o. comes in today for Preventive Health Care visit   Healthy   Had swelling dependent edema after a 8 hours  Conference  but resolved when up and around   Health Maintenance  Topic Date Due  . HIV Screening  04/01/1970  . PAP SMEAR  03/11/2015  . MAMMOGRAM  04/27/2015  . INFLUENZA VACCINE  09/23/2017  . COLONOSCOPY  03/31/2018  . TETANUS/TDAP  08/19/2022  . Hepatitis C Screening  Completed   Health Maintenance Review LIFESTYLE:  Exercise:  Mild to moderate  . 30  Pre day . Tobacco/ETS:  no Alcohol:    About  6 per week  Sugar beverages:  coffee  ocass  Sleep: 7-8  Drug use: no HH of  1  Pet dog   chihu and cat  Work: 45 hours.  ROS:  GEN/ HEENT: No fever, significant weight changes sweats headaches vision problems hearing changes, CV/ PULM; No chest pain shortness of breath cough, syncope,edema  change in exercise tolerance. GI /GU: No adominal pain, vomiting, change in bowel habits. No blood in the stool. No significant GU symptoms. SKIN/HEME: ,no acute skin rashes suspicious lesions or bleeding. No lymphadenopathy, nodules, masses.  NEURO/ PSYCH:  No neurologic signs such as weakness numbness. No depression anxiety. IMM/ Allergy: No unusual infections.  Allergy .   REST of 12 system review negative except as per HPI   Past Medical History:  Diagnosis Date  . Allergic rhinitis   . Osteoarthritis   . Osteopenia   . PPD screening test    negative     Past Surgical History:  Procedure Laterality Date  . BREAST REDUCTION SURGERY    . BREAST REDUCTION SURGERY  2015  . COLONOSCOPY  1999  . REFRACTIVE SURGERY  2004    Family History  Problem Relation Age of Onset  . Hypertension Mother   . Colon cancer Mother   . Goiter Sister   . Colon polyps Sister     . Coronary artery disease Other   . Colon polyps Brother   . Coronary artery disease Brother     Social History   Socioeconomic History  . Marital status: Single    Spouse name: Not on file  . Number of children: Not on file  . Years of education: Not on file  . Highest education level: Not on file  Occupational History  . Not on file  Social Needs  . Financial resource strain: Not on file  . Food insecurity:    Worry: Not on file    Inability: Not on file  . Transportation needs:    Medical: Not on file    Non-medical: Not on file  Tobacco Use  . Smoking status: Never Smoker  . Smokeless tobacco: Never Used  Substance and Sexual Activity  . Alcohol use: Yes    Alcohol/week: 3.0 oz    Types: 5 Cans of beer per week  . Drug use: No  . Sexual activity: Not on file  Lifestyle  . Physical activity:    Days per week: Not on file    Minutes per session: Not on file  . Stress: Not on file  Relationships  . Social connections:    Talks on  phone: Not on file    Gets together: Not on file    Attends religious service: Not on file    Active member of club or organization: Not on file    Attends meetings of clubs or organizations: Not on file    Relationship status: Not on file  Other Topics Concern  . Not on file  Social History Narrative   hh  Of 1    Pet cats   No ets   Employed: [employer]    Allergies as of 06/30/2017   No Known Allergies     Medication List        Accurate as of 06/30/17  2:58 PM. Always use your most recent med list.          ibuprofen 200 MG tablet Commonly known as:  ADVIL,MOTRIN Take 200 mg by mouth every 6 (six) hours as needed. Reported on 04/01/2015   Melatonin 5 MG Tabs Take 1 tablet by mouth at bedtime as needed.         EXAM:  BP 122/72 (BP Location: Right Arm, Patient Position: Sitting, Cuff Size: Normal)   Pulse (!) 58   Temp 97.9 F (36.6 C) (Oral)   Ht '5\' 6"'  (1.676 m)   Wt 149 lb 4.8 oz (67.7 kg)   BMI 24.10 kg/m    Body mass index is 24.1 kg/m. Wt Readings from Last 3 Encounters:  06/30/17 149 lb 4.8 oz (67.7 kg)  01/23/16 149 lb 6.4 oz (67.8 kg)  04/01/15 144 lb (65.3 kg)    Physical Exam: Vital signs reviewed NTZ:GYFV is a well-developed well-nourished alert cooperative    who appearsr stated age in no acute distress.  HEENT: normocephalic atraumatic , Eyes: PERRL EOM's full, conjunctiva clear, Nares: paten,t no deformity discharge or tenderness., Ears: no deformity EAC's clear TMs with normal landmarks. Mouth: clear OP, no lesions, edema.  Moist mucous membranes. Dentition in adequate repair. NECK: supple without masses, thyromegaly or bruits. CHEST/PULM:  Clear to auscultation and percussion breath sounds equal no wheeze , rales or rhonchi. No chest wall deformities or tenderness. Breast: normal by inspection . No dimpling, discharge, masses, tenderness or discharge . Well healed scar  CV: PMI is nondisplaced, S1 S2 no gallops, murmurs, rubs. Peripheral pulses are full without delay.No JVD .  ABDOMEN: Bowel sounds normal nontender  No guard or rebound, no hepato splenomegal no CVA tenderness.  No hernia. Extremtities:  No clubbing cyanosis or edema, no acute joint swelling or redness no focal atrophy NEURO:  Oriented x3, cranial nerves 3-12 appear to be intact, no obvious focal weakness,gait within normal limits no abnormal reflexes or asymmetrical SKIN: No acute rashes normal turgor, color, no bruising or petechiae. PSYCH: Oriented, good eye contact, no obvious depression anxiety, cognition and judgment appear normal. LN: no cervical axillary inguinal adenopathy    BP Readings from Last 3 Encounters:  06/30/17 122/72  01/23/16 124/80  04/01/15 110/60    Lab results reviewed with patient   ASSESSMENT AND PLAN:  Discussed the following assessment and plan:  Visit for preventive health examination - Plan: Basic metabolic panel, CBC with Differential/Platelet, Hepatic function panel,  Lipid panel, TSH  Screening, lipid - Plan: Lipid panel Get mammo   Get pap.   healthy screening   prob had utd mmr   When entered [facility]  In 1992  ( she will look at her records)  Patient Care Team: Burnis Medin, MD as PCP - General Bobbye Charleston, MD as  Attending Physician (Obstetrics and Gynecology) Patient Instructions   Get yoyur mammo and pap .  Check your MMr measles record  .   Health Maintenance, Female Adopting a healthy lifestyle and getting preventive care can go a long way to promote health and wellness. Talk with your health care provider about what schedule of regular examinations is right for you. This is a good chance for you to check in with your provider about disease prevention and staying healthy. In between checkups, there are plenty of things you can do on your own. Experts have done a lot of research about which lifestyle changes and preventive measures are most likely to keep you healthy. Ask your health care provider for more information. Weight and diet Eat a healthy diet  Be sure to include plenty of vegetables, fruits, low-fat dairy products, and lean protein.  Do not eat a lot of foods high in solid fats, added sugars, or salt.  Get regular exercise. This is one of the most important things you can do for your health. ? Most adults should exercise for at least 150 minutes each week. The exercise should increase your heart rate and make you sweat (moderate-intensity exercise). ? Most adults should also do strengthening exercises at least twice a week. This is in addition to the moderate-intensity exercise.  Maintain a healthy weight  Body mass index (BMI) is a measurement that can be used to identify possible weight problems. It estimates body fat based on height and weight. Your health care provider can help determine your BMI and help you achieve or maintain a healthy weight.  For females 38 years of age and older: ? A BMI below 18.5 is  considered underweight. ? A BMI of 18.5 to 24.9 is normal. ? A BMI of 25 to 29.9 is considered overweight. ? A BMI of 30 and above is considered obese.  Watch levels of cholesterol and blood lipids  You should start having your blood tested for lipids and cholesterol at 62 years of age, then have this test every 5 years.  You may need to have your cholesterol levels checked more often if: ? Your lipid or cholesterol levels are high. ? You are older than 62 years of age. ? You are at high risk for heart disease.  Cancer screening Lung Cancer  Lung cancer screening is recommended for adults 13-81 years old who are at high risk for lung cancer because of a history of smoking.  A yearly low-dose CT scan of the lungs is recommended for people who: ? Currently smoke. ? Have quit within the past 15 years. ? Have at least a 30-pack-year history of smoking. A pack year is smoking an average of one pack of cigarettes a day for 1 year.  Yearly screening should continue until it has been 15 years since you quit.  Yearly screening should stop if you develop a health problem that would prevent you from having lung cancer treatment.  Breast Cancer  Practice breast self-awareness. This means understanding how your breasts normally appear and feel.  It also means doing regular breast self-exams. Let your health care provider know about any changes, no matter how small.  If you are in your 20s or 30s, you should have a clinical breast exam (CBE) by a health care provider every 1-3 years as part of a regular health exam.  If you are 20 or older, have a CBE every year. Also consider having a breast X-ray (mammogram) every year.  If you have a family history of breast cancer, talk to your health care provider about genetic screening.  If you are at high risk for breast cancer, talk to your health care provider about having an MRI and a mammogram every year.  Breast cancer gene (BRCA) assessment  is recommended for women who have family members with BRCA-related cancers. BRCA-related cancers include: ? Breast. ? Ovarian. ? Tubal. ? Peritoneal cancers.  Results of the assessment will determine the need for genetic counseling and BRCA1 and BRCA2 testing.  Cervical Cancer Your health care provider may recommend that you be screened regularly for cancer of the pelvic organs (ovaries, uterus, and vagina). This screening involves a pelvic examination, including checking for microscopic changes to the surface of your cervix (Pap test). You may be encouraged to have this screening done every 3 years, beginning at age 26.  For women ages 58-65, health care providers may recommend pelvic exams and Pap testing every 3 years, or they may recommend the Pap and pelvic exam, combined with testing for human papilloma virus (HPV), every 5 years. Some types of HPV increase your risk of cervical cancer. Testing for HPV may also be done on women of any age with unclear Pap test results.  Other health care providers may not recommend any screening for nonpregnant women who are considered low risk for pelvic cancer and who do not have symptoms. Ask your health care provider if a screening pelvic exam is right for you.  If you have had past treatment for cervical cancer or a condition that could lead to cancer, you need Pap tests and screening for cancer for at least 20 years after your treatment. If Pap tests have been discontinued, your risk factors (such as having a new sexual partner) need to be reassessed to determine if screening should resume. Some women have medical problems that increase the chance of getting cervical cancer. In these cases, your health care provider may recommend more frequent screening and Pap tests.  Colorectal Cancer  This type of cancer can be detected and often prevented.  Routine colorectal cancer screening usually begins at 62 years of age and continues through 62 years of  age.  Your health care provider may recommend screening at an earlier age if you have risk factors for colon cancer.  Your health care provider may also recommend using home test kits to check for hidden blood in the stool.  A small camera at the end of a tube can be used to examine your colon directly (sigmoidoscopy or colonoscopy). This is done to check for the earliest forms of colorectal cancer.  Routine screening usually begins at age 38.  Direct examination of the colon should be repeated every 5-10 years through 62 years of age. However, you may need to be screened more often if early forms of precancerous polyps or small growths are found.  Skin Cancer  Check your skin from head to toe regularly.  Tell your health care provider about any new moles or changes in moles, especially if there is a change in a mole's shape or color.  Also tell your health care provider if you have a mole that is larger than the size of a pencil eraser.  Always use sunscreen. Apply sunscreen liberally and repeatedly throughout the day.  Protect yourself by wearing long sleeves, pants, a wide-brimmed hat, and sunglasses whenever you are outside.  Heart disease, diabetes, and high blood pressure  High blood pressure causes heart disease and  increases the risk of stroke. High blood pressure is more likely to develop in: ? People who have blood pressure in the high end of the normal range (130-139/85-89 mm Hg). ? People who are overweight or obese. ? People who are African American.  If you are 22-31 years of age, have your blood pressure checked every 3-5 years. If you are 24 years of age or older, have your blood pressure checked every year. You should have your blood pressure measured twice-once when you are at a hospital or clinic, and once when you are not at a hospital or clinic. Record the average of the two measurements. To check your blood pressure when you are not at a hospital or clinic, you  can use: ? An automated blood pressure machine at a pharmacy. ? A home blood pressure monitor.  If you are between 15 years and 4 years old, ask your health care provider if you should take aspirin to prevent strokes.  Have regular diabetes screenings. This involves taking a blood sample to check your fasting blood sugar level. ? If you are at a normal weight and have a low risk for diabetes, have this test once every three years after 62 years of age. ? If you are overweight and have a high risk for diabetes, consider being tested at a younger age or more often. Preventing infection Hepatitis B  If you have a higher risk for hepatitis B, you should be screened for this virus. You are considered at high risk for hepatitis B if: ? You were born in a country where hepatitis B is common. Ask your health care provider which countries are considered high risk. ? Your parents were born in a high-risk country, and you have not been immunized against hepatitis B (hepatitis B vaccine). ? You have HIV or AIDS. ? You use needles to inject street drugs. ? You live with someone who has hepatitis B. ? You have had sex with someone who has hepatitis B. ? You get hemodialysis treatment. ? You take certain medicines for conditions, including cancer, organ transplantation, and autoimmune conditions.  Hepatitis C  Blood testing is recommended for: ? Everyone born from [birth year] through 1965. ? Anyone with known risk factors for hepatitis C.  Sexually transmitted infections (STIs)  You should be screened for sexually transmitted infections (STIs) including gonorrhea and chlamydia if: ? You are sexually active and are younger than 62 years of age. ? You are older than 62 years of age and your health care provider tells you that you are at risk for this type of infection. ? Your sexual activity has changed since you were last screened and you are at an increased risk for chlamydia or gonorrhea. Ask your  health care provider if you are at risk.  If you do not have HIV, but are at risk, it may be recommended that you take a prescription medicine daily to prevent HIV infection. This is called pre-exposure prophylaxis (PrEP). You are considered at risk if: ? You are sexually active and do not regularly use condoms or know the HIV status of your partner(s). ? You take drugs by injection. ? You are sexually active with a partner who has HIV.  Talk with your health care provider about whether you are at high risk of being infected with HIV. If you choose to begin PrEP, you should first be tested for HIV. You should then be tested every 3 months for as long as you are taking  PrEP. Pregnancy  If you are premenopausal and you may become pregnant, ask your health care provider about preconception counseling.  If you may become pregnant, take 400 to 800 micrograms (mcg) of folic acid every day.  If you want to prevent pregnancy, talk to your health care provider about birth control (contraception). Osteoporosis and menopause  Osteoporosis is a disease in which the bones lose minerals and strength with aging. This can result in serious bone fractures. Your risk for osteoporosis can be identified using a bone density scan.  If you are 44 years of age or older, or if you are at risk for osteoporosis and fractures, ask your health care provider if you should be screened.  Ask your health care provider whether you should take a calcium or vitamin D supplement to lower your risk for osteoporosis.  Menopause may have certain physical symptoms and risks.  Hormone replacement therapy may reduce some of these symptoms and risks. Talk to your health care provider about whether hormone replacement therapy is right for you. Follow these instructions at home:  Schedule regular health, dental, and eye exams.  Stay current with your immunizations.  Do not use any tobacco products including cigarettes, chewing  tobacco, or electronic cigarettes.  If you are pregnant, do not drink alcohol.  If you are breastfeeding, limit how much and how often you drink alcohol.  Limit alcohol intake to no more than 1 drink per day for nonpregnant women. One drink equals 12 ounces of beer, 5 ounces of wine, or 1 ounces of hard liquor.  Do not use street drugs.  Do not share needles.  Ask your health care provider for help if you need support or information about quitting drugs.  Tell your health care provider if you often feel depressed.  Tell your health care provider if you have ever been abused or do not feel safe at home. This information is not intended to replace advice given to you by your health care provider. Make sure you discuss any questions you have with your health care provider. Document Released: 08/25/2010 Document Revised: 07/18/2015 Document Reviewed: 11/13/2014 Elsevier Interactive Patient Education  2018 [place]. Panosh M.D.

## 2017-06-30 ENCOUNTER — Encounter: Payer: 59 | Admitting: Internal Medicine

## 2017-06-30 ENCOUNTER — Ambulatory Visit (INDEPENDENT_AMBULATORY_CARE_PROVIDER_SITE_OTHER): Payer: 59 | Admitting: Internal Medicine

## 2017-06-30 ENCOUNTER — Encounter: Payer: Self-pay | Admitting: Internal Medicine

## 2017-06-30 VITALS — BP 122/72 | HR 58 | Temp 97.9°F | Ht 66.0 in | Wt 149.3 lb

## 2017-06-30 DIAGNOSIS — Z0000 Encounter for general adult medical examination without abnormal findings: Secondary | ICD-10-CM | POA: Diagnosis not present

## 2017-06-30 DIAGNOSIS — Z13220 Encounter for screening for lipoid disorders: Secondary | ICD-10-CM

## 2017-06-30 LAB — LIPID PANEL
CHOLESTEROL: 224 mg/dL — AB (ref 0–200)
HDL: 78.4 mg/dL (ref >39.00)
LDL Cholesterol: 130 mg/dL — ABNORMAL HIGH (ref 0–99)
NonHDL: 145.48
Total CHOL/HDL Ratio: 3
Triglycerides: 76 mg/dL (ref 0.0–149.0)
VLDL: 15.2 mg/dL (ref 0.0–40.0)

## 2017-06-30 LAB — CBC WITH DIFFERENTIAL/PLATELET
Basophils Absolute: 0 10*3/uL (ref 0.0–0.1)
Basophils Relative: 0.6 % (ref 0.0–3.0)
EOS PCT: 1 % (ref 0.0–5.0)
Eosinophils Absolute: 0.1 10*3/uL (ref 0.0–0.7)
HCT: 37.9 % (ref 36.0–46.0)
Hemoglobin: 12.8 g/dL (ref 12.0–15.0)
LYMPHS ABS: 1.6 10*3/uL (ref 0.7–4.0)
Lymphocytes Relative: 26.1 % (ref 12.0–46.0)
MCHC: 33.6 g/dL (ref 30.0–36.0)
MCV: 92 fl (ref 78.0–100.0)
MONOS PCT: 7.8 % (ref 3.0–12.0)
Monocytes Absolute: 0.5 10*3/uL (ref 0.1–1.0)
NEUTROS ABS: 3.9 10*3/uL (ref 1.4–7.7)
NEUTROS PCT: 64.5 % (ref 43.0–77.0)
PLATELETS: 227 10*3/uL (ref 150.0–400.0)
RBC: 4.12 Mil/uL (ref 3.87–5.11)
RDW: 13.1 % (ref 11.5–15.5)
WBC: 6.1 10*3/uL (ref 4.0–10.5)

## 2017-06-30 LAB — BASIC METABOLIC PANEL
BUN: 13 mg/dL (ref 6–23)
CO2: 27 mEq/L (ref 19–32)
Calcium: 9.6 mg/dL (ref 8.4–10.5)
Chloride: 103 mEq/L (ref 96–112)
Creatinine, Ser: 0.81 mg/dL (ref 0.40–1.20)
GFR: 76.09 mL/min (ref >60.00)
GLUCOSE: 89 mg/dL (ref 70–99)
POTASSIUM: 4.3 meq/L (ref 3.5–5.1)
Sodium: 139 mEq/L (ref 135–145)

## 2017-06-30 LAB — HEPATIC FUNCTION PANEL
ALBUMIN: 4.5 g/dL (ref 3.5–5.2)
ALT: 14 U/L (ref 0–35)
AST: 19 U/L (ref 0–37)
Alkaline Phosphatase: 47 U/L (ref 39–117)
Bilirubin, Direct: 0.1 mg/dL (ref 0.0–0.3)
TOTAL PROTEIN: 6.8 g/dL (ref 6.0–8.3)
Total Bilirubin: 0.5 mg/dL (ref 0.2–1.2)

## 2017-06-30 LAB — TSH: TSH: 3.61 u[IU]/mL (ref 0.35–4.50)

## 2017-06-30 NOTE — Patient Instructions (Signed)
Get yoyur mammo and pap .  Check your [facility] measles record  .   Health Maintenance, Female Adopting a healthy lifestyle and getting preventive care can go a long way to promote health and wellness. Talk with your health care provider about what schedule of regular examinations is right for you. This is a good chance for you to check in with your provider about disease prevention and staying healthy. In between checkups, there are plenty of things you can do on your own. Experts have done a lot of research about which lifestyle changes and preventive measures are most likely to keep you healthy. Ask your health care provider for more information. Weight and diet Eat a healthy diet  Be sure to include plenty of vegetables, fruits, low-fat dairy products, and lean protein.  Do not eat a lot of foods high in solid fats, added sugars, or salt.  Get regular exercise. This is one of the most important things you can do for your health. ? Most adults should exercise for at least 150 minutes each week. The exercise should increase your heart rate and make you sweat (moderate-intensity exercise). ? Most adults should also do strengthening exercises at least twice a week. This is in addition to the moderate-intensity exercise.  Maintain a healthy weight  Body mass index (BMI) is a measurement that can be used to identify possible weight problems. It estimates body fat based on height and weight. Your health care provider can help determine your BMI and help you achieve or maintain a healthy weight.  For females 2 years of age and older: ? A BMI below 18.5 is considered underweight. ? A BMI of 18.5 to 24.9 is normal. ? A BMI of 25 to 29.9 is considered overweight. ? A BMI of 30 and above is considered obese.  Watch levels of cholesterol and blood lipids  You should start having your blood tested for lipids and cholesterol at 62 years of age, then have this test every 5 years.  You may need to  have your cholesterol levels checked more often if: ? Your lipid or cholesterol levels are high. ? You are older than 62 years of age. ? You are at high risk for heart disease.  Cancer screening Lung Cancer  Lung cancer screening is recommended for adults 63-48 years old who are at high risk for lung cancer because of a history of smoking.  A yearly low-dose CT scan of the lungs is recommended for people who: ? Currently smoke. ? Have quit within the past 15 years. ? Have at least a 30-pack-year history of smoking. A pack year is smoking an average of one pack of cigarettes a day for 1 year.  Yearly screening should continue until it has been 15 years since you quit.  Yearly screening should stop if you develop a health problem that would prevent you from having lung cancer treatment.  Breast Cancer  Practice breast self-awareness. This means understanding how your breasts normally appear and feel.  It also means doing regular breast self-exams. Let your health care provider know about any changes, no matter how small.  If you are in your 20s or 30s, you should have a clinical breast exam (CBE) by a health care provider every 1-3 years as part of a regular health exam.  If you are 6 or older, have a CBE every year. Also consider having a breast X-ray (mammogram) every year.  If you have a family history of breast cancer,  talk to your health care provider about genetic screening.  If you are at high risk for breast cancer, talk to your health care provider about having an MRI and a mammogram every year.  Breast cancer gene (BRCA) assessment is recommended for women who have family members with BRCA-related cancers. BRCA-related cancers include: ? Breast. ? Ovarian. ? Tubal. ? Peritoneal cancers.  Results of the assessment will determine the need for genetic counseling and BRCA1 and BRCA2 testing.  Cervical Cancer Your health care provider may recommend that you be screened  regularly for cancer of the pelvic organs (ovaries, uterus, and vagina). This screening involves a pelvic examination, including checking for microscopic changes to the surface of your cervix (Pap test). You may be encouraged to have this screening done every 3 years, beginning at age 27.  For women ages 48-65, health care providers may recommend pelvic exams and Pap testing every 3 years, or they may recommend the Pap and pelvic exam, combined with testing for human papilloma virus (HPV), every 5 years. Some types of HPV increase your risk of cervical cancer. Testing for HPV may also be done on women of any age with unclear Pap test results.  Other health care providers may not recommend any screening for nonpregnant women who are considered low risk for pelvic cancer and who do not have symptoms. Ask your health care provider if a screening pelvic exam is right for you.  If you have had past treatment for cervical cancer or a condition that could lead to cancer, you need Pap tests and screening for cancer for at least 20 years after your treatment. If Pap tests have been discontinued, your risk factors (such as having a new sexual partner) need to be reassessed to determine if screening should resume. Some women have medical problems that increase the chance of getting cervical cancer. In these cases, your health care provider may recommend more frequent screening and Pap tests.  Colorectal Cancer  This type of cancer can be detected and often prevented.  Routine colorectal cancer screening usually begins at 62 years of age and continues through 62 years of age.  Your health care provider may recommend screening at an earlier age if you have risk factors for colon cancer.  Your health care provider may also recommend using home test kits to check for hidden blood in the stool.  A small camera at the end of a tube can be used to examine your colon directly (sigmoidoscopy or colonoscopy). This is  done to check for the earliest forms of colorectal cancer.  Routine screening usually begins at age 74.  Direct examination of the colon should be repeated every 5-10 years through 62 years of age. However, you may need to be screened more often if early forms of precancerous polyps or small growths are found.  Skin Cancer  Check your skin from head to toe regularly.  Tell your health care provider about any new moles or changes in moles, especially if there is a change in a mole's shape or color.  Also tell your health care provider if you have a mole that is larger than the size of a pencil eraser.  Always use sunscreen. Apply sunscreen liberally and repeatedly throughout the day.  Protect yourself by wearing long sleeves, pants, a wide-brimmed hat, and sunglasses whenever you are outside.  Heart disease, diabetes, and high blood pressure  High blood pressure causes heart disease and increases the risk of stroke. High blood pressure is  more likely to develop in: ? People who have blood pressure in the high end of the normal range (130-139/85-89 mm Hg). ? People who are overweight or obese. ? People who are African American.  If you are 90-6 years of age, have your blood pressure checked every 3-5 years. If you are 13 years of age or older, have your blood pressure checked every year. You should have your blood pressure measured twice-once when you are at a hospital or clinic, and once when you are not at a hospital or clinic. Record the average of the two measurements. To check your blood pressure when you are not at a hospital or clinic, you can use: ? An automated blood pressure machine at a pharmacy. ? A home blood pressure monitor.  If you are between 68 years and 32 years old, ask your health care provider if you should take aspirin to prevent strokes.  Have regular diabetes screenings. This involves taking a blood sample to check your fasting blood sugar level. ? If you are  at a normal weight and have a low risk for diabetes, have this test once every three years after 62 years of age. ? If you are overweight and have a high risk for diabetes, consider being tested at a younger age or more often. Preventing infection Hepatitis B  If you have a higher risk for hepatitis B, you should be screened for this virus. You are considered at high risk for hepatitis B if: ? You were born in a country where hepatitis B is common. Ask your health care provider which countries are considered high risk. ? Your parents were born in a high-risk country, and you have not been immunized against hepatitis B (hepatitis B vaccine). ? You have HIV or AIDS. ? You use needles to inject street drugs. ? You live with someone who has hepatitis B. ? You have had sex with someone who has hepatitis B. ? You get hemodialysis treatment. ? You take certain medicines for conditions, including cancer, organ transplantation, and autoimmune conditions.  Hepatitis C  Blood testing is recommended for: ? Everyone born from [birth year] through 1965. ? Anyone with known risk factors for hepatitis C.  Sexually transmitted infections (STIs)  You should be screened for sexually transmitted infections (STIs) including gonorrhea and chlamydia if: ? You are sexually active and are younger than 62 years of age. ? You are older than 62 years of age and your health care provider tells you that you are at risk for this type of infection. ? Your sexual activity has changed since you were last screened and you are at an increased risk for chlamydia or gonorrhea. Ask your health care provider if you are at risk.  If you do not have HIV, but are at risk, it may be recommended that you take a prescription medicine daily to prevent HIV infection. This is called pre-exposure prophylaxis (PrEP). You are considered at risk if: ? You are sexually active and do not regularly use condoms or know the HIV status of your  partner(s). ? You take drugs by injection. ? You are sexually active with a partner who has HIV.  Talk with your health care provider about whether you are at high risk of being infected with HIV. If you choose to begin PrEP, you should first be tested for HIV. You should then be tested every 3 months for as long as you are taking PrEP. Pregnancy  If you are premenopausal and you  may become pregnant, ask your health care provider about preconception counseling.  If you may become pregnant, take 400 to 800 micrograms (mcg) of folic acid every day.  If you want to prevent pregnancy, talk to your health care provider about birth control (contraception). Osteoporosis and menopause  Osteoporosis is a disease in which the bones lose minerals and strength with aging. This can result in serious bone fractures. Your risk for osteoporosis can be identified using a bone density scan.  If you are 59 years of age or older, or if you are at risk for osteoporosis and fractures, ask your health care provider if you should be screened.  Ask your health care provider whether you should take a calcium or vitamin D supplement to lower your risk for osteoporosis.  Menopause may have certain physical symptoms and risks.  Hormone replacement therapy may reduce some of these symptoms and risks. Talk to your health care provider about whether hormone replacement therapy is right for you. Follow these instructions at home:  Schedule regular health, dental, and eye exams.  Stay current with your immunizations.  Do not use any tobacco products including cigarettes, chewing tobacco, or electronic cigarettes.  If you are pregnant, do not drink alcohol.  If you are breastfeeding, limit how much and how often you drink alcohol.  Limit alcohol intake to no more than 1 drink per day for nonpregnant women. One drink equals 12 ounces of beer, 5 ounces of wine, or 1 ounces of hard liquor.  Do not use street  drugs.  Do not share needles.  Ask your health care provider for help if you need support or information about quitting drugs.  Tell your health care provider if you often feel depressed.  Tell your health care provider if you have ever been abused or do not feel safe at home. This information is not intended to replace advice given to you by your health care provider. Make sure you discuss any questions you have with your health care provider. Document Released: 08/25/2010 Document Revised: 07/18/2015 Document Reviewed: 11/13/2014 Elsevier Interactive Patient Education  [employer].

## 2017-08-09 ENCOUNTER — Encounter: Payer: 59 | Admitting: Internal Medicine

## 2017-09-29 ENCOUNTER — Telehealth: Payer: 59 | Admitting: Nurse Practitioner

## 2017-09-29 DIAGNOSIS — N3000 Acute cystitis without hematuria: Secondary | ICD-10-CM

## 2017-09-29 MED ORDER — CIPROFLOXACIN HCL 500 MG PO TABS: 500.0000 mg | ORAL_TABLET | Freq: Two times a day (BID) | ORAL | 0 refills | Status: DC

## 2017-09-29 MED FILL — CIPROFLOXACIN HCL 500 MG TA: 500 | 5 days supply | Qty: 10 | Fill #0

## 2017-09-29 NOTE — Progress Notes (Signed)

## 2018-01-28 DIAGNOSIS — Z124 Encounter for screening for malignant neoplasm of cervix: Secondary | ICD-10-CM | POA: Diagnosis not present

## 2018-01-28 DIAGNOSIS — Z1231 Encounter for screening mammogram for malignant neoplasm of breast: Secondary | ICD-10-CM | POA: Diagnosis not present

## 2018-01-28 DIAGNOSIS — Z01419 Encounter for gynecological examination (general) (routine) without abnormal findings: Secondary | ICD-10-CM | POA: Diagnosis not present

## 2018-01-31 ENCOUNTER — Other Ambulatory Visit: Payer: Self-pay | Admitting: Obstetrics and Gynecology

## 2018-01-31 DIAGNOSIS — R928 Other abnormal and inconclusive findings on diagnostic imaging of breast: Secondary | ICD-10-CM

## 2018-02-03 ENCOUNTER — Other Ambulatory Visit: Payer: Self-pay | Admitting: Obstetrics and Gynecology

## 2018-02-03 ENCOUNTER — Ambulatory Visit
Admission: RE | Admit: 2018-02-03 | Discharge: 2018-02-03 | Disposition: A | Discharge status: Home / Self Care | Payer: 59 | Source: Ambulatory Visit | Attending: Obstetrics and Gynecology | Admitting: Obstetrics and Gynecology

## 2018-02-03 DIAGNOSIS — N6489 Other specified disorders of breast: Secondary | ICD-10-CM

## 2018-02-03 DIAGNOSIS — R928 Other abnormal and inconclusive findings on diagnostic imaging of breast: Secondary | ICD-10-CM

## 2018-05-08 ENCOUNTER — Encounter: Payer: Self-pay | Admitting: Internal Medicine

## 2018-08-11 ENCOUNTER — Other Ambulatory Visit: Payer: 59

## 2019-04-21 DIAGNOSIS — Z01419 Encounter for gynecological examination (general) (routine) without abnormal findings: Secondary | ICD-10-CM | POA: Diagnosis not present

## 2019-10-27 ENCOUNTER — Encounter: Payer: Self-pay | Admitting: *Deleted

## 2019-10-27 ENCOUNTER — Other Ambulatory Visit: Payer: Self-pay | Admitting: Internal Medicine

## 2019-10-27 DIAGNOSIS — Z1211 Encounter for screening for malignant neoplasm of colon: Secondary | ICD-10-CM

## 2019-10-27 DIAGNOSIS — Z86010 Personal history of colonic polyps: Secondary | ICD-10-CM

## 2019-11-17 ENCOUNTER — Encounter: Payer: 59 | Admitting: Internal Medicine

## 2019-12-19 ENCOUNTER — Encounter: Payer: Self-pay | Admitting: *Deleted

## 2019-12-28 ENCOUNTER — Other Ambulatory Visit (HOSPITAL_COMMUNITY): Payer: Self-pay | Admitting: Internal Medicine

## 2020-01-26 ENCOUNTER — Encounter: Payer: 59 | Admitting: Internal Medicine

## 2020-02-06 ENCOUNTER — Other Ambulatory Visit: Payer: Self-pay | Admitting: Obstetrics and Gynecology

## 2020-02-06 DIAGNOSIS — N6489 Other specified disorders of breast: Secondary | ICD-10-CM

## 2020-02-09 ENCOUNTER — Encounter: Payer: Self-pay | Admitting: Internal Medicine

## 2020-02-09 ENCOUNTER — Ambulatory Visit (AMBULATORY_SURGERY_CENTER): Payer: 59 | Admitting: Internal Medicine

## 2020-02-09 ENCOUNTER — Other Ambulatory Visit: Payer: Self-pay

## 2020-02-09 VITALS — BP 108/57 | HR 56 | Temp 97.3°F | Resp 20 | Ht 66.0 in | Wt 149.0 lb

## 2020-02-09 DIAGNOSIS — D122 Benign neoplasm of ascending colon: Secondary | ICD-10-CM | POA: Diagnosis not present

## 2020-02-09 DIAGNOSIS — Z86010 Personal history of colonic polyps: Secondary | ICD-10-CM

## 2020-02-09 DIAGNOSIS — D124 Benign neoplasm of descending colon: Secondary | ICD-10-CM

## 2020-02-09 DIAGNOSIS — D120 Benign neoplasm of cecum: Secondary | ICD-10-CM

## 2020-02-09 DIAGNOSIS — K635 Polyp of colon: Secondary | ICD-10-CM | POA: Diagnosis not present

## 2020-02-09 DIAGNOSIS — Z1211 Encounter for screening for malignant neoplasm of colon: Secondary | ICD-10-CM | POA: Diagnosis not present

## 2020-02-09 MED ORDER — SODIUM CHLORIDE 0.9 % IV SOLN: 500.0000 mL | Freq: Once | INTRAVENOUS | Status: DC

## 2020-02-09 NOTE — Progress Notes (Signed)
Called to room to assist during endoscopic procedure.  Patient ID and intended procedure confirmed with present staff. Received instructions for my participation in the procedure from the performing physician.  

## 2020-02-09 NOTE — Progress Notes (Signed)
PT taken to PACU. Monitors in place. VSS. Report given to RN. 

## 2020-02-09 NOTE — Progress Notes (Signed)
Vs in adm by n campbell,rn

## 2020-02-09 NOTE — Patient Instructions (Signed)
Information on polyps and diverticulosis given to you. ° °Await pathology results. ° °Resume previous diet and medications. ° ° °YOU HAD AN ENDOSCOPIC PROCEDURE TODAY AT THE [facility]:   Refer to the procedure report that was given to you for any specific questions about what was found during the examination.  If the procedure report does not answer your questions, please call your gastroenterologist to clarify.  If you requested that your care partner not be given the details of your procedure findings, then the procedure report has been included in a sealed envelope for you to review at your convenience later. ° °YOU SHOULD EXPECT: Some feelings of bloating in the abdomen. Passage of more gas than usual.  Walking can help get rid of the air that was put into your GI tract during the procedure and reduce the bloating. If you had a lower endoscopy (such as a colonoscopy or flexible sigmoidoscopy) you may notice spotting of blood in your stool or on the toilet paper. If you underwent a bowel prep for your procedure, you may not have a normal bowel movement for a few days. ° °Please Note:  You might notice some irritation and congestion in your nose or some drainage.  This is from the oxygen used during your procedure.  There is no need for concern and it should clear up in a day or so. ° °SYMPTOMS TO REPORT IMMEDIATELY: ° °Following lower endoscopy (colonoscopy or flexible sigmoidoscopy): ° Excessive amounts of blood in the stool ° Significant tenderness or worsening of abdominal pains ° Swelling of the abdomen that is new, acute ° Fever of 100°F or higher ° °For urgent or emergent issues, a gastroenterologist can be reached at any hour by calling [phone]. °Do not use MyChart messaging for urgent concerns.  ° ° °DIET:  We do recommend a small meal at first, but then you may proceed to your regular diet.  Drink plenty of fluids but you should avoid alcoholic beverages for 24 hours. ° °ACTIVITY:   You should plan to take it easy for the rest of today and you should NOT DRIVE or use heavy machinery until tomorrow (because of the sedation medicines used during the test).   ° °FOLLOW UP: °Our staff will call the number listed on your records 48-72 hours following your procedure to check on you and address any questions or concerns that you may have regarding the information given to you following your procedure. If we do not reach you, we will leave a message.  We will attempt to reach you two times.  During this call, we will ask if you have developed any symptoms of COVID 19. If you develop any symptoms (ie: fever, flu-like symptoms, shortness of breath, cough etc.) before then, please call [phone].  If you test positive for Covid 19 in the 2 weeks post procedure, please call and report this information to us.   ° °If any biopsies were taken you will be contacted by phone or by letter within the next 1-3 weeks.  Please call us at [phone] if you have not heard about the biopsies in 3 weeks.  ° ° °SIGNATURES/CONFIDENTIALITY: °You and/or your care partner have signed paperwork which will be entered into your electronic medical record.  These signatures attest to the fact that that the information above on your After Visit Summary has been reviewed and is understood.  Full responsibility of the confidentiality of this discharge information lies with you and/or your care-partner.  °  care-partner.

## 2020-02-09 NOTE — Op Note (Signed)
[facility] Patient Name: Peggy Harrington Procedure Date: 02/09/2020 2:43 PM MRN: [id number] Endoscopist: Jerene Bears , MD Age: 64 Referring MD:  Date of Birth: 1955-10-02 Gender: Female Account #: [id number] Procedure:                Colonoscopy Indications:              High risk colon cancer surveillance: Personal                            history of sessile serrated colon polyp (less than                            10 mm in size) with no dysplasia x 2, Family                            history of colon cancer in a first-degree, Last                            colonoscopy: February 2017 Medicines:                Propofol per Anesthesia Procedure:                Pre-Anesthesia Assessment:                           - Prior to the procedure, a History and Physical                            was performed, and patient medications and                            allergies were reviewed. The patient's tolerance of                            previous anesthesia was also reviewed. The risks                            and benefits of the procedure and the sedation                            options and risks were discussed with the patient.                            All questions were answered, and informed consent                            was obtained. Prior Anticoagulants: The patient has                            taken no previous anticoagulant or antiplatelet                            agents. ASA Grade Assessment: II - A patient with  mild systemic disease. After reviewing the risks                            and benefits, the patient was deemed in                            satisfactory condition to undergo the procedure.                           After obtaining informed consent, the colonoscope                            was passed under direct vision. Throughout the                            procedure, the patient's blood pressure, pulse, and                             oxygen saturations were monitored continuously. The                            Olympus [id number]-[id number]) Colonoscope was                            introduced through the anus and advanced to the                            cecum, identified by appendiceal orifice and                            ileocecal valve. The colonoscopy was performed                            without difficulty. The patient tolerated the                            procedure well. The quality of the bowel                            preparation was good. The ileocecal valve,                            appendiceal orifice, and rectum were photographed. Scope In: 2:57:58 PM Scope Out: 3:16:24 PM Scope Withdrawal Time: 0 hours 16 minutes 4 seconds  Total Procedure Duration: 0 hours 18 minutes 26 seconds  Findings:                 The digital rectal exam was normal.                           A 3 mm polyp was found in the cecum. The polyp was                            sessile. The polyp was removed with a cold snare.  Resection and retrieval were complete.                           A 7 mm polyp was found in the ascending colon. The                            polyp was sessile. The polyp was removed with a                            cold snare. Resection and retrieval were complete.                           A 4 mm polyp was found in the descending colon. The                            polyp was sessile. The polyp was removed with a                            cold snare. Resection and retrieval were complete.                           Multiple small-mouthed diverticula were found in                            the sigmoid colon.                           The retroflexed view of the distal rectum and anal                            verge was normal and showed no anal or rectal                            abnormalities. Complications:            No immediate  complications. Estimated Blood Loss:     Estimated blood loss was minimal. Impression:               - One 3 mm polyp in the cecum, removed with a cold                            snare. Resected and retrieved.                           - One 7 mm polyp in the ascending colon, removed                            with a cold snare. Resected and retrieved.                           - One 4 mm polyp in the descending colon, removed                            with  a cold snare. Resected and retrieved.                           - Diverticulosis in the sigmoid colon.                           - The distal rectum and anal verge are normal on                            retroflexion view. Recommendation:           - Patient has a contact number available for                            emergencies. The signs and symptoms of potential                            delayed complications were discussed with the                            patient. Return to normal activities tomorrow.                            Written discharge instructions were provided to the                            patient.                           - Resume previous diet.                           - Continue present medications.                           - Await pathology results.                           - Repeat colonoscopy is recommended for                            surveillance. The colonoscopy date will be                            determined after pathology results from today's                            exam become available for review. Jerene Bears, MD 02/09/2020 3:20:39 PM This report has been signed electronically.

## 2020-02-13 ENCOUNTER — Telehealth: Payer: Self-pay

## 2020-02-13 ENCOUNTER — Telehealth: Payer: Self-pay | Admitting: *Deleted

## 2020-02-13 NOTE — Telephone Encounter (Signed)
Follow up call made, left message. 

## 2020-02-13 NOTE — Telephone Encounter (Signed)
  Follow up Call-  Call back number 02/09/2020  Post procedure Call Back phone  # [phone]  Permission to leave phone message Yes  Some recent data might be hidden     Patient questions:  Do you have a fever, pain , or abdominal swelling? No. Pain Score  0 *  Have you tolerated food without any problems? Yes.    Have you been able to return to your normal activities? Yes.    Do you have any questions about your discharge instructions: Diet   No. Medications  No. Follow up visit  No.  Do you have questions or concerns about your Care? No.  Actions: * If pain score is 4 or above: No action needed, pain <4.  1. Have you developed a fever since your procedure? no  2.   Have you had an respiratory symptoms (SOB or cough) since your procedure? no  3.   Have you tested positive for COVID 19 since your procedure no  4.   Have you had any family members/close contacts diagnosed with the COVID 19 since your procedure?  no   If yes to any of these questions please route to Joylene John, RN and Joella Prince, RN

## 2020-02-20 ENCOUNTER — Encounter: Payer: Self-pay | Admitting: Internal Medicine

## 2020-02-23 ENCOUNTER — Other Ambulatory Visit (HOSPITAL_COMMUNITY): Payer: Self-pay | Admitting: Internal Medicine

## 2020-03-04 DIAGNOSIS — Z1152 Encounter for screening for COVID-19: Secondary | ICD-10-CM | POA: Diagnosis not present

## 2020-03-08 MED FILL — SHINGRIX 50 MCG SUS: 50 | 1 days supply | Qty: 1 | Fill #1

## 2020-03-11 MED FILL — BOOSTRIX VACCINE SYRINGE: 5-2.5-18.5 | 1 days supply | Qty: 1 | Fill #0

## 2020-03-19 DIAGNOSIS — L814 Other melanin hyperpigmentation: Secondary | ICD-10-CM | POA: Diagnosis not present

## 2020-03-19 DIAGNOSIS — L918 Other hypertrophic disorders of the skin: Secondary | ICD-10-CM | POA: Diagnosis not present

## 2020-03-19 DIAGNOSIS — L578 Other skin changes due to chronic exposure to nonionizing radiation: Secondary | ICD-10-CM | POA: Diagnosis not present

## 2020-03-19 DIAGNOSIS — L821 Other seborrheic keratosis: Secondary | ICD-10-CM | POA: Diagnosis not present

## 2020-03-19 DIAGNOSIS — L570 Actinic keratosis: Secondary | ICD-10-CM | POA: Diagnosis not present

## 2020-03-19 DIAGNOSIS — D225 Melanocytic nevi of trunk: Secondary | ICD-10-CM | POA: Diagnosis not present

## 2020-04-19 ENCOUNTER — Ambulatory Visit
Admission: RE | Admit: 2020-04-19 | Discharge: 2020-04-19 | Disposition: A | Discharge status: Home / Self Care | Payer: 59 | Source: Ambulatory Visit | Attending: Obstetrics and Gynecology | Admitting: Obstetrics and Gynecology

## 2020-04-19 ENCOUNTER — Other Ambulatory Visit: Payer: Self-pay

## 2020-04-19 ENCOUNTER — Ambulatory Visit: Payer: 59

## 2020-04-19 DIAGNOSIS — N6489 Other specified disorders of breast: Secondary | ICD-10-CM

## 2020-04-19 DIAGNOSIS — R928 Other abnormal and inconclusive findings on diagnostic imaging of breast: Secondary | ICD-10-CM | POA: Diagnosis not present

## 2020-07-15 DIAGNOSIS — M79672 Pain in left foot: Secondary | ICD-10-CM | POA: Diagnosis not present

## 2020-09-01 NOTE — Progress Notes (Signed)
Chief Complaint  Patient presents with   Annual Exam     HPI: Patient  Peggy Harrington  65 y.o. comes in today for Preventive Health Care visit   Mom passed  78  in past year and has had    Unintentional weight loss but feels healthy.   Asks for refill of ambien short term if needed .   Also would like to have rx on hand for PI   if gets  ( dog risk)  topical and has used pred 10 for a few days early  to control.    Health Maintenance  Topic Date Due   HIV Screening  Never done   PAP SMEAR-Modifier  03/11/2015   PNA vac Low Risk Adult (1 of 2 - PCV13) 04/01/2020   COVID-19 Vaccine (4 - Booster for Pfizer series) 04/02/2020   INFLUENZA VACCINE  09/23/2020   MAMMOGRAM  04/19/2022   COLONOSCOPY (Pts 45-13yrs Insurance coverage will need to be confirmed)  02/09/2023   TETANUS/TDAP  03/11/2030   DEXA SCAN  Completed   Hepatitis C Screening  Completed   Zoster Vaccines- Shingrix  Completed   HPV VACCINES  Aged Out   Health Maintenance Review LIFESTYLE:  Exercise:  water aerobics and dog walking Tobacco/ETS:n Alcohol: bottle per month  Sugar beverages: Sleep: about 7-8  Drug use: no HH of  dog  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     ROS:  GEN/ HEENT: No fever, significant weight changes sweats headaches vision problems hearing changes, CV/ PULM; No chest pain shortness of breath cough, syncope,edema  change in exercise tolerance. GI /GU: No adominal pain, vomiting, change in bowel habits. No blood in the stool. No significant GU symptoms. SKIN/HEME: ,no acute skin rashes suspicious lesions or bleeding. No lymphadenopathy, nodules, masses.  NEURO/ PSYCH:  No neurologic signs such as weakness numbness. No depression anxiety. IMM/ Allergy: No unusual infections.  Allergy .   REST of 12 system review negative except as per HPI   Past Medical History:  Diagnosis Date   Allergic rhinitis    Osteoarthritis    Osteopenia    PPD screening test    negative     Past  Surgical History:  Procedure Laterality Date   BREAST REDUCTION SURGERY     BREAST REDUCTION SURGERY  2015   COLONOSCOPY  1999   left wrist surgery     REFRACTIVE SURGERY  2004   Brother  mi in late 66s   father mi in 76s  Family History  Problem Relation Age of Onset   Colon polyps Brother    Coronary artery disease Brother    Hypertension Mother    Colon cancer Mother    Goiter Sister    Colon polyps Sister    Coronary artery disease Other    Heart disease Father     Social History   Socioeconomic History   Marital status: Single    Spouse name: Not on file   Number of children: Not on file   Years of education: Not on file   Highest education level: Not on file  Occupational History   Not on file  Tobacco Use   Smoking status: Never   Smokeless tobacco: Never  Substance and Sexual Activity   Alcohol use: Yes    Alcohol/week: 5.0 standard drinks    Types: 5 Cans of beer per week   Drug use: No   Sexual activity: Not  on file  Other Topics Concern   Not on file  Social History Narrative   hh  Of 1    Pet cats   No ets   Employed: [employer]   Social Determinants of Health   Financial Resource Strain: Not on file  Food Insecurity: Not on file  Transportation Needs: Not on file  Physical Activity: Not on file  Stress: Not on file  Social Connections: Not on file    Outpatient Medications Prior to Visit  Medication Sig Dispense Refill   ibuprofen (ADVIL,MOTRIN) 200 MG tablet Take 200 mg by mouth every 6 (six) hours as needed. Reported on 04/01/2015     Melatonin 5 MG TABS Take 1 tablet by mouth at bedtime as needed.     Tdap (BOOSTRIX) 5-2.5-18.5 LF-MCG/0.5 injection TO BE ADMINISTERED BY PHARMACIST .5 mL 0   Zoster Vaccine Adjuvanted (SHINGRIX) injection TO BE ADMINISTERED BY PHARMACIST. 1 each 1   No facility-administered medications prior to visit.     EXAM:  BP 118/60 (BP Location: Left Arm, Patient Position: Sitting, Cuff Size: Normal)   Pulse (!) 54    Temp 97.9 F (36.6 C) (Temporal)   Ht 5\' 7"  (1.702 m)   Wt 133 lb (60.3 kg)   SpO2 98%   BMI 20.83 kg/m   Body mass index is 20.83 kg/m. Wt Readings from Last 3 Encounters:  09/02/20 133 lb (60.3 kg)  02/09/20 149 lb (67.6 kg)  06/30/17 149 lb 4.8 oz (67.7 kg)    Physical Exam: Vital signs reviewed QPY:PPJK is a well-developed well-nourished alert cooperative    who appearsr stated age in no acute distress.  HEENT: normocephalic atraumatic , Eyes: PERRL EOM's full, conjunctiva clear, Nares: paten,t no deformity discharge or tenderness., Ears: no deformity EAC's clear tortuous canal TMs with normal landmarks. Mouth masked  NECK: supple without masses, thyromegaly or bruits. CHEST/PULM:  Clear to auscultation and percussion breath sounds equal no wheeze , rales or rhonchi. No chest wall deformities or tenderness. Breast: normal by inspection . No dimpling, discharge, masses, tenderness or discharge . CV: PMI is nondisplaced, S1 S2 no gallops, murmurs, rubs. Peripheral pulses are full without delay.No JVD .  ABDOMEN: Bowel sounds normal nontender  No guard or rebound, no hepato splenomegal no CVA tenderness.   Extremtities:  No clubbing cyanosis or edema, no acute joint swelling or redness no focal atrophy NEURO:  Oriented x3, cranial nerves 3-12 appear to be intact, no obvious focal weakness,gait within normal limits no abnormal reflexes or asymmetrical SKIN: No acute rashes normal turgor, color, no bruising or petechiae. PSYCH: Oriented, good eye contact, no obvious depression anxiety, cognition and judgment appear normal. LN: no cervical axillary inguinal adenopathy  Lab Results  Component Value Date   WBC 6.8 09/02/2020   HGB 12.5 09/02/2020   HCT 37.0 09/02/2020   PLT 284.0 09/02/2020   GLUCOSE 84 09/02/2020   CHOL 220 (H) 09/02/2020   TRIG 83.0 09/02/2020   HDL 70.10 09/02/2020   LDLDIRECT 118.0 08/18/2012   LDLCALC 133 (H) 09/02/2020   ALT 14 09/02/2020   AST 17  09/02/2020   NA 139 09/02/2020   K 4.1 09/02/2020   CL 104 09/02/2020   CREATININE 0.82 09/02/2020   BUN 16 09/02/2020   CO2 25 09/02/2020   TSH 2.94 09/02/2020    BP Readings from Last 3 Encounters:  09/02/20 118/60  02/09/20 (!) 108/57  06/30/17 122/72    Lab plan reviewed with patient   ASSESSMENT AND  PLAN:  Discussed the following assessment and plan:    ICD-10-CM   1. Visit for preventive health examination  F02.77 Basic metabolic panel    CBC with Differential/Platelet    Hepatic function panel    TSH    Lipid panel    T4, free    T4, free    Lipid panel    TSH    Hepatic function panel    CBC with Differential/Platelet    Basic metabolic panel    2. Elevated LDL cholesterol level  A12.87 Basic metabolic panel    CBC with Differential/Platelet    Hepatic function panel    TSH    Lipid panel    T4, free    T4, free    Lipid panel    TSH    Hepatic function panel    CBC with Differential/Platelet    Basic metabolic panel    3. Need for pneumococcal vaccination  Z23 Pneumococcal conjugate vaccine 20-valent (Prevnar 20)    4. History of acute dermatitis PI  Z87.2     5. Situational insomnia  F51.09     6. Medication management  Z79.899     Steroids if needed with instructions. Temovate limit 2 weeks and not on face.  Caution with ambien.  Fam hx of  early CV   Return in about 1 year (around 09/02/2021) for depending on results.  Patient Care Team: Kuron Docken, Standley Brooking, MD as PCP - General Bobbye Charleston, MD as Attending Physician (Obstetrics and Gynecology) Patient Instructions  Will notify you  of labs when available.   Let us know if you want [place] to order dexa scan .   Prevnar 20 today.   Health Maintenance, Female Adopting a healthy lifestyle and getting preventive care are important in promoting health and wellness. Ask your health care provider about: The right schedule for you to have regular tests and exams. Things you can do on your own  to prevent diseases and keep yourself healthy. What should I know about diet, weight, and exercise? Eat a healthy diet  Eat a diet that includes plenty of vegetables, fruits, low-fat dairy products, and lean protein. Do not eat a lot of foods that are high in solid fats, added sugars, or sodium.  Maintain a healthy weight Body mass index (BMI) is used to identify weight problems. It estimates body fat based on height and weight. Your health care provider can help determineyour BMI and help you achieve or maintain a healthy weight. Get regular exercise Get regular exercise. This is one of the most important things you can do for your health. Most adults should: Exercise for at least 150 minutes each week. The exercise should increase your heart rate and make you sweat (moderate-intensity exercise). Do strengthening exercises at least twice a week. This is in addition to the moderate-intensity exercise. Spend less time sitting. Even light physical activity can be beneficial. Watch cholesterol and blood lipids Have your blood tested for lipids and cholesterol at 65 years of age, then havethis test every 5 years. Have your cholesterol levels checked more often if: Your lipid or cholesterol levels are high. You are older than 65 years of age. You are at high risk for heart disease. What should I know about cancer screening? Depending on your health history and family history, you may need to have cancer screening at various ages. This may include screening for: Breast cancer. Cervical cancer. Colorectal cancer. Skin cancer. Lung cancer. What should I know  about heart disease, diabetes, and high blood pressure? Blood pressure and heart disease High blood pressure causes heart disease and increases the risk of stroke. This is more likely to develop in people who have high blood pressure readings, are of African descent, or are overweight. Have your blood pressure checked: Every 3-5 years if  you are 74-38 years of age. Every year if you are 30 years old or older. Diabetes Have regular diabetes screenings. This checks your fasting blood sugar level. Have the screening done: Once every three years after age 61 if you are at a normal weight and have a low risk for diabetes. More often and at a younger age if you are overweight or have a high risk for diabetes. What should I know about preventing infection? Hepatitis B If you have a higher risk for hepatitis B, you should be screened for this virus. Talk with your health care provider to find out if you are at risk forhepatitis B infection. Hepatitis C Testing is recommended for: Everyone born from [birth year] through 1965. Anyone with known risk factors for hepatitis C. Sexually transmitted infections (STIs) Get screened for STIs, including gonorrhea and chlamydia, if: You are sexually active and are younger than 65 years of age. You are older than 65 years of age and your health care provider tells you that you are at risk for this type of infection. Your sexual activity has changed since you were last screened, and you are at increased risk for chlamydia or gonorrhea. Ask your health care provider if you are at risk. Ask your health care provider about whether you are at high risk for HIV. Your health care provider may recommend a prescription medicine to help prevent HIV infection. If you choose to take medicine to prevent HIV, you should first get tested for HIV. You should then be tested every 3 months for as long as you are taking the medicine. Pregnancy If you are about to stop having your period (premenopausal) and you may become pregnant, seek counseling before you get pregnant. Take 400 to 800 micrograms (mcg) of folic acid every day if you become pregnant. Ask for birth control (contraception) if you want to prevent pregnancy. Osteoporosis and menopause Osteoporosis is a disease in which the bones lose minerals and strength with  aging. This can result in bone fractures. If you are 47 years old or older, or if you are at risk for osteoporosis and fractures, ask your health care provider if you should: Be screened for bone loss. Take a calcium or vitamin D supplement to lower your risk of fractures. Be given hormone replacement therapy (HRT) to treat symptoms of menopause. Follow these instructions at home: Lifestyle Do not use any products that contain nicotine or tobacco, such as cigarettes, e-cigarettes, and chewing tobacco. If you need help quitting, ask your health care provider. Do not use street drugs. Do not share needles. Ask your health care provider for help if you need support or information about quitting drugs. Alcohol use Do not drink alcohol if: Your health care provider tells you not to drink. You are pregnant, may be pregnant, or are planning to become pregnant. If you drink alcohol: Limit how much you use to 0-1 drink a day. Limit intake if you are breastfeeding. Be aware of how much alcohol is in your drink. In the [place] one drink equals one 12 oz bottle of beer (355 mL), one 5 oz glass of wine (148 mL), or one 1 oz glass  of hard liquor (44 mL). General instructions Schedule regular health, dental, and eye exams. Stay current with your vaccines. Tell your health care provider if: You often feel depressed. You have ever been abused or do not feel safe at home. Summary Adopting a healthy lifestyle and getting preventive care are important in promoting health and wellness. Follow your health care provider's instructions about healthy diet, exercising, and getting tested or screened for diseases. Follow your health care provider's instructions on monitoring your cholesterol and blood pressure. This information is not intended to replace advice given to you by your health care provider. Make sure you discuss any questions you have with your healthcare provider. Document Revised: 02/02/2018 Document  Reviewed: 02/02/2018 Elsevier Patient Education  2022 [place]. Yuri Flener M.D.

## 2020-09-02 ENCOUNTER — Ambulatory Visit (INDEPENDENT_AMBULATORY_CARE_PROVIDER_SITE_OTHER): Payer: 59 | Admitting: Internal Medicine

## 2020-09-02 ENCOUNTER — Other Ambulatory Visit: Payer: Self-pay

## 2020-09-02 ENCOUNTER — Encounter: Payer: Self-pay | Admitting: Internal Medicine

## 2020-09-02 ENCOUNTER — Other Ambulatory Visit (HOSPITAL_COMMUNITY): Payer: Self-pay

## 2020-09-02 VITALS — BP 118/60 | HR 54 | Temp 97.9°F | Ht 67.0 in | Wt 133.0 lb

## 2020-09-02 DIAGNOSIS — Z872 Personal history of diseases of the skin and subcutaneous tissue: Secondary | ICD-10-CM

## 2020-09-02 DIAGNOSIS — E7800 Pure hypercholesterolemia, unspecified: Secondary | ICD-10-CM | POA: Diagnosis not present

## 2020-09-02 DIAGNOSIS — Z0000 Encounter for general adult medical examination without abnormal findings: Secondary | ICD-10-CM | POA: Diagnosis not present

## 2020-09-02 DIAGNOSIS — F5109 Other insomnia not due to a substance or known physiological condition: Secondary | ICD-10-CM | POA: Diagnosis not present

## 2020-09-02 DIAGNOSIS — Z23 Encounter for immunization: Secondary | ICD-10-CM | POA: Diagnosis not present

## 2020-09-02 DIAGNOSIS — Z79899 Other long term (current) drug therapy: Secondary | ICD-10-CM | POA: Diagnosis not present

## 2020-09-02 LAB — LIPID PANEL
Cholesterol: 220 mg/dL — ABNORMAL HIGH (ref 0–200)
HDL: 70.1 mg/dL (ref >39.00)
LDL Cholesterol: 133 mg/dL — ABNORMAL HIGH (ref 0–99)
NonHDL: 149.59
Total CHOL/HDL Ratio: 3
Triglycerides: 83 mg/dL (ref 0.0–149.0)
VLDL: 16.6 mg/dL (ref 0.0–40.0)

## 2020-09-02 LAB — HEPATIC FUNCTION PANEL
ALT: 14 U/L (ref 0–35)
AST: 17 U/L (ref 0–37)
Albumin: 4.5 g/dL (ref 3.5–5.2)
Alkaline Phosphatase: 49 U/L (ref 39–117)
Bilirubin, Direct: 0.1 mg/dL (ref 0.0–0.3)
Total Bilirubin: 0.5 mg/dL (ref 0.2–1.2)
Total Protein: 6.8 g/dL (ref 6.0–8.3)

## 2020-09-02 LAB — CBC WITH DIFFERENTIAL/PLATELET
Basophils Absolute: 0 10*3/uL (ref 0.0–0.1)
Basophils Relative: 0.5 % (ref 0.0–3.0)
Eosinophils Absolute: 0.1 10*3/uL (ref 0.0–0.7)
Eosinophils Relative: 0.9 % (ref 0.0–5.0)
HCT: 37 % (ref 36.0–46.0)
Hemoglobin: 12.5 g/dL (ref 12.0–15.0)
Lymphocytes Relative: 25.3 % (ref 12.0–46.0)
Lymphs Abs: 1.7 10*3/uL (ref 0.7–4.0)
MCHC: 33.8 g/dL (ref 30.0–36.0)
MCV: 90.2 fl (ref 78.0–100.0)
Monocytes Absolute: 0.4 10*3/uL (ref 0.1–1.0)
Monocytes Relative: 6.1 % (ref 3.0–12.0)
Neutro Abs: 4.6 10*3/uL (ref 1.4–7.7)
Neutrophils Relative %: 67.2 % (ref 43.0–77.0)
Platelets: 284 10*3/uL (ref 150.0–400.0)
RBC: 4.1 Mil/uL (ref 3.87–5.11)
RDW: 13.3 % (ref 11.5–15.5)
WBC: 6.8 10*3/uL (ref 4.0–10.5)

## 2020-09-02 LAB — BASIC METABOLIC PANEL
BUN: 16 mg/dL (ref 6–23)
CO2: 25 mEq/L (ref 19–32)
Calcium: 9.6 mg/dL (ref 8.4–10.5)
Chloride: 104 mEq/L (ref 96–112)
Creatinine, Ser: 0.82 mg/dL (ref 0.40–1.20)
GFR: 75.06 mL/min (ref >60.00)
Glucose, Bld: 84 mg/dL (ref 70–99)
Potassium: 4.1 mEq/L (ref 3.5–5.1)
Sodium: 139 mEq/L (ref 135–145)

## 2020-09-02 LAB — TSH: TSH: 2.94 u[IU]/mL (ref 0.35–5.50)

## 2020-09-02 LAB — T4, FREE: Free T4: 0.72 ng/dL (ref 0.60–1.60)

## 2020-09-02 MED ORDER — ZOLPIDEM TARTRATE 5 MG PO TABS
5.0000 mg | ORAL_TABLET | Freq: Every evening | ORAL | 1 refills | Status: DC | PRN
  Filled 2020-09-02: qty 20, 20d supply, fill #0

## 2020-09-02 MED ORDER — CLOBETASOL PROPIONATE 0.05 % EX CREA
1.0000 "application " | TOPICAL_CREAM | Freq: Two times a day (BID) | CUTANEOUS | 0 refills | Status: AC
  Filled 2020-09-02: qty 30, 15d supply, fill #0

## 2020-09-02 MED ORDER — PREDNISONE 10 MG PO TABS
ORAL_TABLET | ORAL | 0 refills | Status: DC
  Filled 2020-09-02: qty 40, 12d supply, fill #0

## 2020-09-02 NOTE — Patient Instructions (Signed)
Will notify you  of labs when available.   Let us know if you want [place] to order dexa scan .   Prevnar 20 today.   Health Maintenance, Female Adopting a healthy lifestyle and getting preventive care are important in promoting health and wellness. Ask your health care provider about: The right schedule for you to have regular tests and exams. Things you can do on your own to prevent diseases and keep yourself healthy. What should I know about diet, weight, and exercise? Eat a healthy diet  Eat a diet that includes plenty of vegetables, fruits, low-fat dairy products, and lean protein. Do not eat a lot of foods that are high in solid fats, added sugars, or sodium.  Maintain a healthy weight Body mass index (BMI) is used to identify weight problems. It estimates body fat based on height and weight. Your health care provider can help determineyour BMI and help you achieve or maintain a healthy weight. Get regular exercise Get regular exercise. This is one of the most important things you can do for your health. Most adults should: Exercise for at least 150 minutes each week. The exercise should increase your heart rate and make you sweat (moderate-intensity exercise). Do strengthening exercises at least twice a week. This is in addition to the moderate-intensity exercise. Spend less time sitting. Even light physical activity can be beneficial. Watch cholesterol and blood lipids Have your blood tested for lipids and cholesterol at 64 years of age, then havethis test every 5 years. Have your cholesterol levels checked more often if: Your lipid or cholesterol levels are high. You are older than 65 years of age. You are at high risk for heart disease. What should I know about cancer screening? Depending on your health history and family history, you may need to have cancer screening at various ages. This may include screening for: Breast cancer. Cervical cancer. Colorectal cancer. Skin  cancer. Lung cancer. What should I know about heart disease, diabetes, and high blood pressure? Blood pressure and heart disease High blood pressure causes heart disease and increases the risk of stroke. This is more likely to develop in people who have high blood pressure readings, are of African descent, or are overweight. Have your blood pressure checked: Every 3-5 years if you are 3-29 years of age. Every year if you are 74 years old or older. Diabetes Have regular diabetes screenings. This checks your fasting blood sugar level. Have the screening done: Once every three years after age 75 if you are at a normal weight and have a low risk for diabetes. More often and at a younger age if you are overweight or have a high risk for diabetes. What should I know about preventing infection? Hepatitis B If you have a higher risk for hepatitis B, you should be screened for this virus. Talk with your health care provider to find out if you are at risk forhepatitis B infection. Hepatitis C Testing is recommended for: Everyone born from [birth year] through 1965. Anyone with known risk factors for hepatitis C. Sexually transmitted infections (STIs) Get screened for STIs, including gonorrhea and chlamydia, if: You are sexually active and are younger than 65 years of age. You are older than 65 years of age and your health care provider tells you that you are at risk for this type of infection. Your sexual activity has changed since you were last screened, and you are at increased risk for chlamydia or gonorrhea. Ask your health care provider if  you are at risk. Ask your health care provider about whether you are at high risk for HIV. Your health care provider may recommend a prescription medicine to help prevent HIV infection. If you choose to take medicine to prevent HIV, you should first get tested for HIV. You should then be tested every 3 months for as long as you are taking the medicine. Pregnancy If  you are about to stop having your period (premenopausal) and you may become pregnant, seek counseling before you get pregnant. Take 400 to 800 micrograms (mcg) of folic acid every day if you become pregnant. Ask for birth control (contraception) if you want to prevent pregnancy. Osteoporosis and menopause Osteoporosis is a disease in which the bones lose minerals and strength with aging. This can result in bone fractures. If you are 64 years old or older, or if you are at risk for osteoporosis and fractures, ask your health care provider if you should: Be screened for bone loss. Take a calcium or vitamin D supplement to lower your risk of fractures. Be given hormone replacement therapy (HRT) to treat symptoms of menopause. Follow these instructions at home: Lifestyle Do not use any products that contain nicotine or tobacco, such as cigarettes, e-cigarettes, and chewing tobacco. If you need help quitting, ask your health care provider. Do not use street drugs. Do not share needles. Ask your health care provider for help if you need support or information about quitting drugs. Alcohol use Do not drink alcohol if: Your health care provider tells you not to drink. You are pregnant, may be pregnant, or are planning to become pregnant. If you drink alcohol: Limit how much you use to 0-1 drink a day. Limit intake if you are breastfeeding. Be aware of how much alcohol is in your drink. In the [place] one drink equals one 12 oz bottle of beer (355 mL), one 5 oz glass of wine (148 mL), or one 1 oz glass of hard liquor (44 mL). General instructions Schedule regular health, dental, and eye exams. Stay current with your vaccines. Tell your health care provider if: You often feel depressed. You have ever been abused or do not feel safe at home. Summary Adopting a healthy lifestyle and getting preventive care are important in promoting health and wellness. Follow your health care provider's  instructions about healthy diet, exercising, and getting tested or screened for diseases. Follow your health care provider's instructions on monitoring your cholesterol and blood pressure. This information is not intended to replace advice given to you by your health care provider. Make sure you discuss any questions you have with your healthcare provider. Document Revised: 02/02/2018 Document Reviewed: 02/02/2018 Elsevier Patient Education  2022 [employer].

## 2020-09-05 NOTE — Progress Notes (Signed)
Normal / in range labs  x cholesterol . But still favorable     The 10-year ASCVD risk score Peggy Harrington., et al., 2013) is: 4.5%   Values used to calculate the score:     Age: 65 years     Sex: Female     Is Non-Hispanic African American: No     Diabetic: No     Tobacco smoker: No     Systolic Blood Pressure: 657 mmHg     Is BP treated: No     HDL Cholesterol: 70.1 mg/dL     Total Cholesterol: 220 mg/dL

## 2020-12-24 ENCOUNTER — Other Ambulatory Visit (HOSPITAL_BASED_OUTPATIENT_CLINIC_OR_DEPARTMENT_OTHER): Payer: Self-pay

## 2020-12-24 ENCOUNTER — Ambulatory Visit: Payer: 59 | Attending: Internal Medicine

## 2020-12-24 DIAGNOSIS — Z23 Encounter for immunization: Secondary | ICD-10-CM

## 2020-12-24 MED ORDER — PFIZER COVID-19 VAC BIVALENT 30 MCG/0.3ML IM SUSP
INTRAMUSCULAR | 0 refills | Status: AC
  Filled 2020-12-24: qty 0.3, 1d supply, fill #0

## 2020-12-24 NOTE — Progress Notes (Signed)
   Covid-19 Vaccination Clinic  Name:  ALMINA SCHUL    MRN: [id number] DOB: October 31, 1955  12/24/2020  Ms. Thane was observed post Covid-19 immunization for 15 minutes without incident. She was provided with Vaccine Information Sheet and instruction to access the V-Safe system.   Ms. Iannaccone was instructed to call 911 with any severe reactions post vaccine: Difficulty breathing  Swelling of face and throat  A fast heartbeat  A bad rash all over body  Dizziness and weakness   Immunizations Administered     Name Date Dose VIS Date Route   Pfizer Covid-19 Vaccine Bivalent Booster 12/24/2020  2:48 PM 0.3 mL 10/23/2020 Intramuscular   Manufacturer: [place]   Lot: [id number]   [place]: [phone]

## 2021-01-29 ENCOUNTER — Other Ambulatory Visit (HOSPITAL_COMMUNITY): Payer: Self-pay

## 2021-01-29 MED ORDER — HYDROCODONE-ACETAMINOPHEN 7.5-325 MG PO TABS
0.5000 | ORAL_TABLET | ORAL | 0 refills | Status: DC | PRN
  Filled 2021-01-29 – 2021-02-20 (×2): qty 6, 1d supply, fill #0

## 2021-01-29 MED ORDER — KETOROLAC TROMETHAMINE 10 MG PO TABS
10.0000 mg | ORAL_TABLET | Freq: Four times a day (QID) | ORAL | 0 refills | Status: DC
  Filled 2021-01-29 – 2021-02-20 (×2): qty 16, 4d supply, fill #0

## 2021-02-06 ENCOUNTER — Other Ambulatory Visit (HOSPITAL_COMMUNITY): Payer: Self-pay

## 2021-02-20 ENCOUNTER — Other Ambulatory Visit (HOSPITAL_COMMUNITY): Payer: Self-pay

## 2021-03-13 IMAGING — MG DIGITAL DIAGNOSTIC BILAT W/ TOMO W/ CAD
8 series · 8 of 24 positions shown · non-contrast
Comparison: Previous exam(s).

CLINICAL DATA: 65-year-old female who now returns for delayed
evaluation of LEFT breast screening study asymmetry identified on
01/28/2018 screening exam.History of bilateral breast reductions.

EXAM:
DIGITAL DIAGNOSTIC BILATERAL MAMMOGRAM WITH CAD AND TOMO

[R MLO synth-2D]
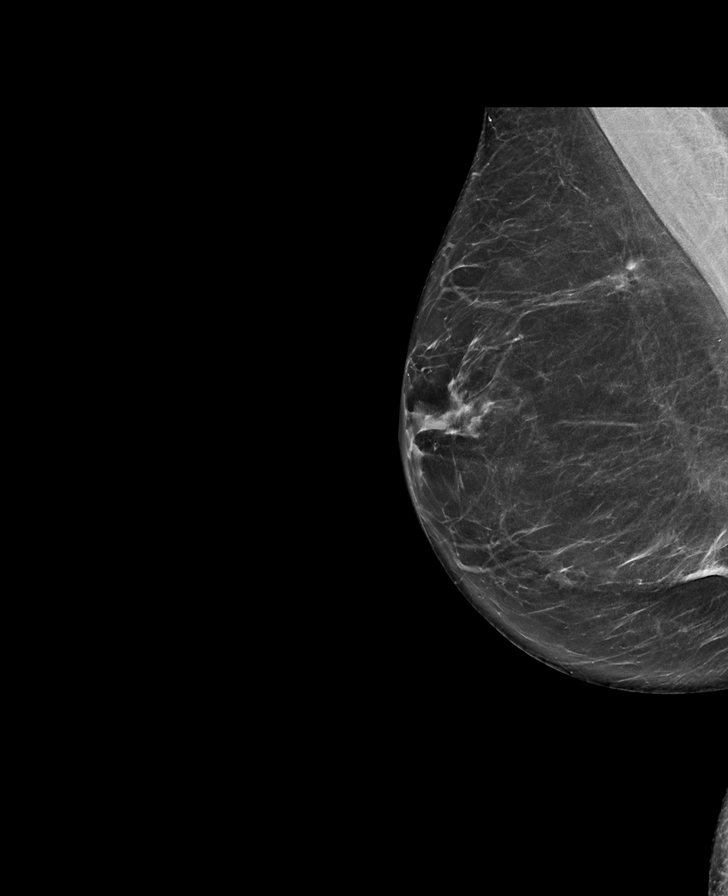

[R CC synth-2D]
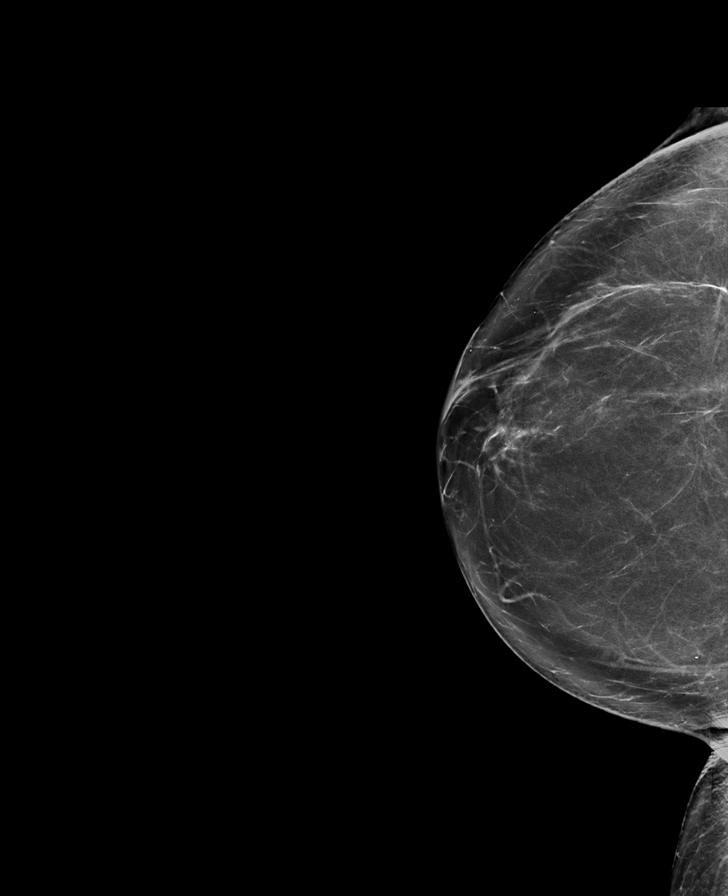

[L CC synth-2D]
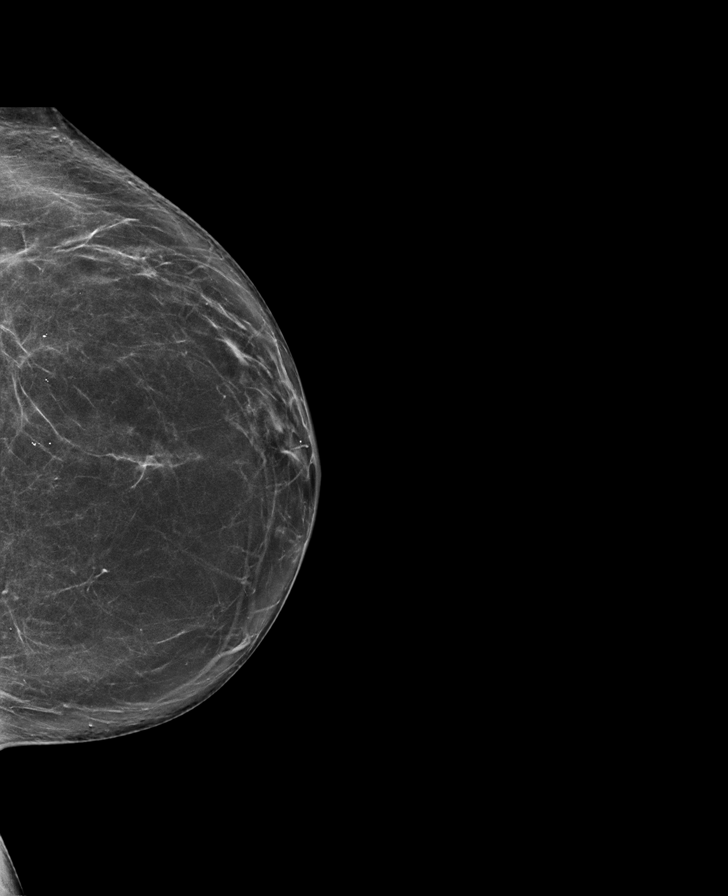

[L MLO synth-2D]
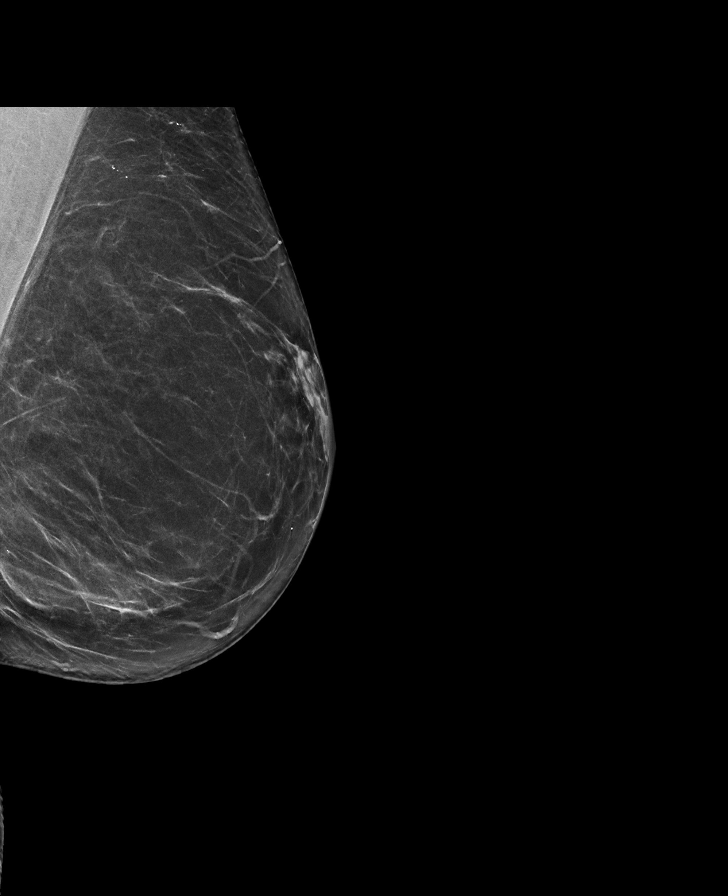

[R MLO tomo · tomo slice 39/78.0]
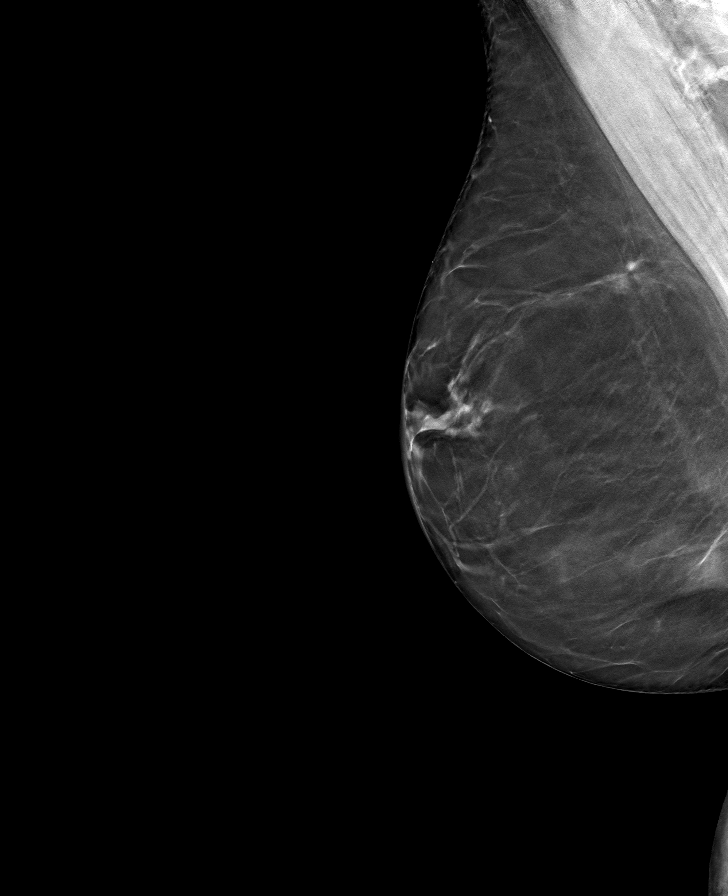

[R CC tomo · tomo slice 40/79.0]
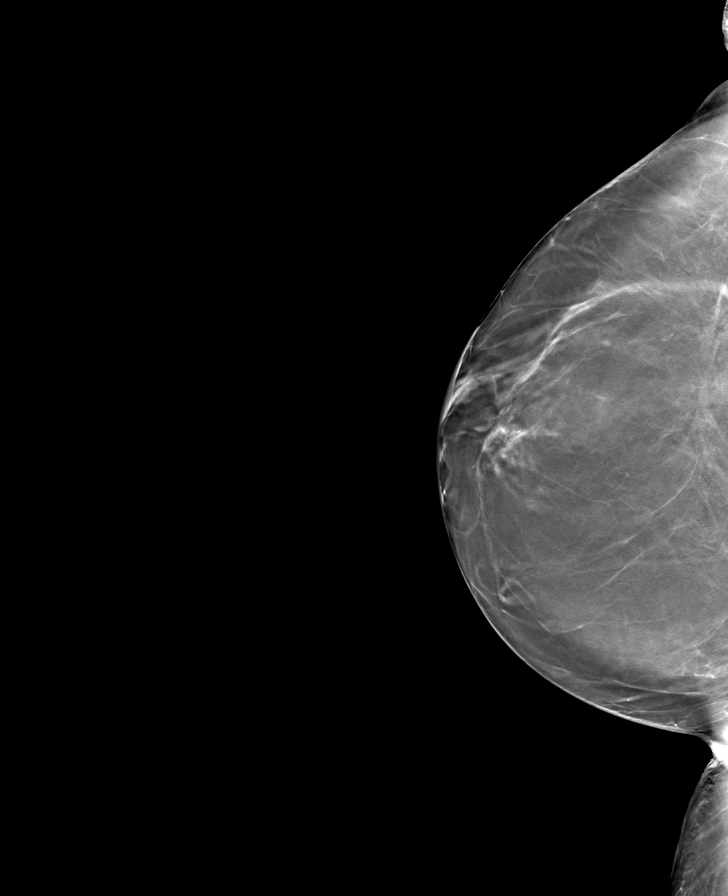

[L MLO tomo · tomo slice 40/79.0]
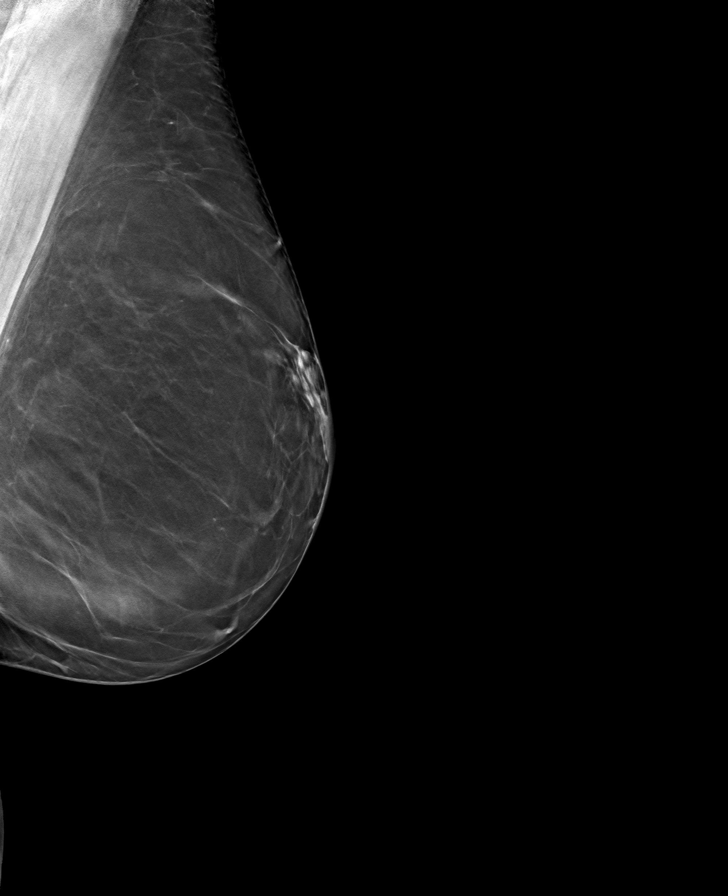

[L CC tomo · tomo slice 39/78.0]
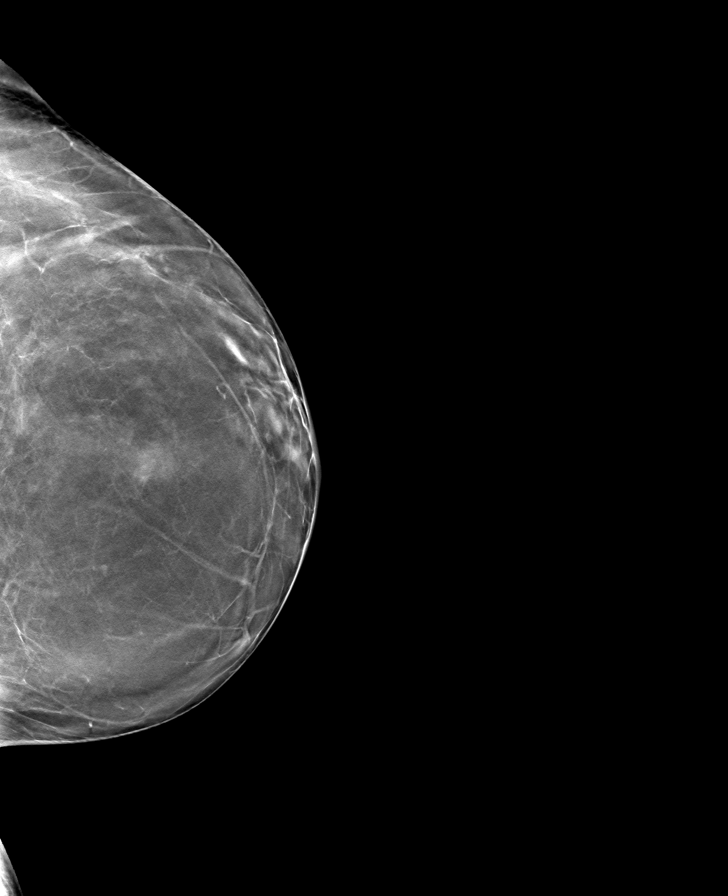

[8 of 24 positions shown; findings below may reference images not displayed]

ACR Breast Density Category b: There are scattered areas of
fibroglandular density.
FINDINGS: 2D/3D full field views of both breasts demonstrate no suspicious
mass, nonsurgical distortion or worrisome calcifications.

The LEFT breast asymmetry identified on the 7013 screening exam is
much less prominent.

Reduction changes within both breasts noted.

Mammographic images were processed with CAD.
IMPRESSION: No persistent suspicious abnormality at the site of the screening
study finding.

No mammographic evidence of breast malignancy.

Bilateral breast reduction changes.

RECOMMENDATION:
Bilateral screening mammogram in 1 year.

I have discussed the findings and recommendations with the patient.
If applicable, a reminder letter will be sent to the patient
regarding the next appointment.

BI-RADS CATEGORY  2: Benign.

## 2021-07-24 DIAGNOSIS — L814 Other melanin hyperpigmentation: Secondary | ICD-10-CM | POA: Diagnosis not present

## 2021-07-24 DIAGNOSIS — C44612 Basal cell carcinoma of skin of right upper limb, including shoulder: Secondary | ICD-10-CM | POA: Diagnosis not present

## 2021-07-24 DIAGNOSIS — D485 Neoplasm of uncertain behavior of skin: Secondary | ICD-10-CM | POA: Diagnosis not present

## 2021-07-24 DIAGNOSIS — L578 Other skin changes due to chronic exposure to nonionizing radiation: Secondary | ICD-10-CM | POA: Diagnosis not present

## 2021-07-24 DIAGNOSIS — D225 Melanocytic nevi of trunk: Secondary | ICD-10-CM | POA: Diagnosis not present

## 2021-07-24 DIAGNOSIS — L821 Other seborrheic keratosis: Secondary | ICD-10-CM | POA: Diagnosis not present

## 2021-08-07 DIAGNOSIS — C44519 Basal cell carcinoma of skin of other part of trunk: Secondary | ICD-10-CM | POA: Diagnosis not present

## 2021-08-07 DIAGNOSIS — C44612 Basal cell carcinoma of skin of right upper limb, including shoulder: Secondary | ICD-10-CM | POA: Diagnosis not present

## 2021-10-13 DIAGNOSIS — Z0000 Encounter for general adult medical examination without abnormal findings: Secondary | ICD-10-CM | POA: Insufficient documentation

## 2021-10-13 DIAGNOSIS — E7800 Pure hypercholesterolemia, unspecified: Secondary | ICD-10-CM | POA: Insufficient documentation

## 2021-10-13 NOTE — Progress Notes (Unsigned)
No chief complaint on file.   HPI: Patient  Peggy Harrington  66 y.o. comes in today for Preventive Health Care visit   Health Maintenance  Topic Date Due   COVID-19 Vaccine (5 - Pfizer risk series) 02/18/2021   INFLUENZA VACCINE  09/23/2021   MAMMOGRAM  04/19/2022   COLONOSCOPY (Pts 45-37yr Insurance coverage will need to be confirmed)  02/09/2023   TETANUS/TDAP  03/11/2030   Pneumonia Vaccine 66 Years old  Completed   DEXA SCAN  Completed   Hepatitis C Screening  Completed   Zoster Vaccines- Shingrix  Completed   HPV VACCINES  Aged Out   Health Maintenance Review LIFESTYLE:  Exercise:   Tobacco/ETS: Alcohol:  Sugar beverages: Sleep: Drug use: no HH of  Work:    ROS:  GEN/ HEENT: No fever, significant weight changes sweats headaches vision problems hearing changes, CV/ PULM; No chest pain shortness of breath cough, syncope,edema  change in exercise tolerance. GI /GU: No adominal pain, vomiting, change in bowel habits. No blood in the stool. No significant GU symptoms. SKIN/HEME: ,no acute skin rashes suspicious lesions or bleeding. No lymphadenopathy, nodules, masses.  NEURO/ PSYCH:  No neurologic signs such as weakness numbness. No depression anxiety. IMM/ Allergy: No unusual infections.  Allergy .   REST of 12 system review negative except as per HPI   Past Medical History:  Diagnosis Date   Allergic rhinitis    Osteoarthritis    Osteopenia    PPD screening test    negative     Past Surgical History:  Procedure Laterality Date   BREAST REDUCTION SURGERY     BREAST REDUCTION SURGERY  2015   COLONOSCOPY  1999   left wrist surgery     REFRACTIVE SURGERY  2004    Family History  Problem Relation Age of Onset   Colon polyps Brother    Coronary artery disease Brother    Hypertension Mother    Colon cancer Mother    Goiter Sister    Colon polyps Sister    Coronary artery disease Other    Heart disease Father     Social History   Socioeconomic  History   Marital status: Single    Spouse name: Not on file   Number of children: Not on file   Years of education: Not on file   Highest education level: Not on file  Occupational History   Not on file  Tobacco Use   Smoking status: Never   Smokeless tobacco: Never  Substance and Sexual Activity   Alcohol use: Yes    Alcohol/week: 5.0 standard drinks of alcohol    Types: 5 Cans of beer per week   Drug use: No   Sexual activity: Not on file  Other Topics Concern   Not on file  Social History Narrative   hh  Of 1    Pet cats   No ets   Employed: PA   Social Determinants of HRadio broadcast assistantStrain: Not on file  Food Insecurity: Not on file  Transportation Needs: Not on file  Physical Activity: Not on file  Stress: Not on file  Social Connections: Not on file    Outpatient Medications Prior to Visit  Medication Sig Dispense Refill   clobetasol cream (TEMOVATE) 09.37% Apply 1 application topically 2 (two) times daily. As needed for  poison ivy 30 g 0   COVID-19 mRNA bivalent vaccine, Pfizer, (PFIZER COVID-19 VAC BIVALENT) injection Inject into the muscle.  0.3 mL 0   HYDROcodone-acetaminophen (NORCO) 7.5-325 MG tablet Take 0.5-1 tablets by mouth every 4-6 hours as needed for severe pain 6 tablet 0   ibuprofen (ADVIL,MOTRIN) 200 MG tablet Take 200 mg by mouth every 6 (six) hours as needed. Reported on 04/01/2015     ketorolac (TORADOL) 10 MG tablet Take 1 tablet (10 mg total) by mouth 4 (four) times daily. Start six hours after IV injection. IV injection will be given in office prior to surgery. 16 tablet 0   Melatonin 5 MG TABS Take 1 tablet by mouth at bedtime as needed.     predniSONE (DELTASONE) 10 MG tablet Take 6 tablets by mouth daily for 3 days then 4 tablets daily for 3 days then 2 tablets daily for 3 days then 1 tablet daily for 3 days  for poison ivy (6-6-6-4-4-4-2-2-2-1-1-1) 40 tablet 0   zolpidem (AMBIEN) 5 MG tablet Take 1 tablet (5 mg total) by mouth at  bedtime as needed for sleep. 20 tablet 1   No facility-administered medications prior to visit.     EXAM:  There were no vitals taken for this visit.  There is no height or weight on file to calculate BMI. Wt Readings from Last 3 Encounters:  09/02/20 133 lb (60.3 kg)  02/09/20 149 lb (67.6 kg)  06/30/17 149 lb 4.8 oz (67.7 kg)    Physical Exam: Vital signs reviewed JHE:RDEY is a well-developed well-nourished alert cooperative    who appearsr stated age in no acute distress.  HEENT: normocephalic atraumatic , Eyes: PERRL EOM's full, conjunctiva clear, Nares: paten,t no deformity discharge or tenderness., Ears: no deformity EAC's clear TMs with normal landmarks. Mouth: clear OP, no lesions, edema.  Moist mucous membranes. Dentition in adequate repair. NECK: supple without masses, thyromegaly or bruits. CHEST/PULM:  Clear to auscultation and percussion breath sounds equal no wheeze , rales or rhonchi. No chest wall deformities or tenderness. Breast: normal by inspection . No dimpling, discharge, masses, tenderness or discharge . CV: PMI is nondisplaced, S1 S2 no gallops, murmurs, rubs. Peripheral pulses are full without delay.No JVD .  ABDOMEN: Bowel sounds normal nontender  No guard or rebound, no hepato splenomegal no CVA tenderness.  No hernia. Extremtities:  No clubbing cyanosis or edema, no acute joint swelling or redness no focal atrophy NEURO:  Oriented x3, cranial nerves 3-12 appear to be intact, no obvious focal weakness,gait within normal limits no abnormal reflexes or asymmetrical SKIN: No acute rashes normal turgor, color, no bruising or petechiae. PSYCH: Oriented, good eye contact, no obvious depression anxiety, cognition and judgment appear normal. LN: no cervical axillary inguinal adenopathy  Lab Results  Component Value Date   WBC 6.8 09/02/2020   HGB 12.5 09/02/2020   HCT 37.0 09/02/2020   PLT 284.0 09/02/2020   GLUCOSE 84 09/02/2020   CHOL 220 (H) 09/02/2020    TRIG 83.0 09/02/2020   HDL 70.10 09/02/2020   LDLDIRECT 118.0 08/18/2012   LDLCALC 133 (H) 09/02/2020   ALT 14 09/02/2020   AST 17 09/02/2020   NA 139 09/02/2020   K 4.1 09/02/2020   CL 104 09/02/2020   CREATININE 0.82 09/02/2020   BUN 16 09/02/2020   CO2 25 09/02/2020   TSH 2.94 09/02/2020    BP Readings from Last 3 Encounters:  09/02/20 118/60  02/09/20 (!) 108/57  06/30/17 122/72    Lab rplanreviewed with patient   ASSESSMENT AND PLAN:  Discussed the following assessment and plan:    ICD-10-CM  1. Visit for preventive health examination  Z00.00     2. Elevated LDL cholesterol level  E78.00      No follow-ups on file.  Patient Care Team: Chaunda Vandergriff, Standley Brooking, MD as PCP - General Bobbye Charleston, MD as Attending Physician (Obstetrics and Gynecology) There are no Patient Instructions on file for this visit.  Standley Brooking. Royale Swamy M.D.

## 2021-10-14 ENCOUNTER — Encounter: Payer: Self-pay | Admitting: Internal Medicine

## 2021-10-14 ENCOUNTER — Ambulatory Visit (INDEPENDENT_AMBULATORY_CARE_PROVIDER_SITE_OTHER): Payer: 59 | Admitting: Internal Medicine

## 2021-10-14 VITALS — BP 98/60 | HR 56 | Temp 97.7°F | Ht 67.0 in | Wt 132.2 lb

## 2021-10-14 DIAGNOSIS — Z0000 Encounter for general adult medical examination without abnormal findings: Secondary | ICD-10-CM | POA: Diagnosis not present

## 2021-10-14 DIAGNOSIS — Z8249 Family history of ischemic heart disease and other diseases of the circulatory system: Secondary | ICD-10-CM | POA: Diagnosis not present

## 2021-10-14 DIAGNOSIS — E7800 Pure hypercholesterolemia, unspecified: Secondary | ICD-10-CM

## 2021-10-14 LAB — BASIC METABOLIC PANEL WITH GFR
BUN: 14 mg/dL (ref 6–23)
CO2: 25 meq/L (ref 19–32)
Calcium: 9 mg/dL (ref 8.4–10.5)
Chloride: 105 meq/L (ref 96–112)
Creatinine, Ser: 0.9 mg/dL (ref 0.40–1.20)
GFR: 66.61 mL/min
Glucose, Bld: 86 mg/dL (ref 70–99)
Potassium: 4.5 meq/L (ref 3.5–5.1)
Sodium: 139 meq/L (ref 135–145)

## 2021-10-14 LAB — HEPATIC FUNCTION PANEL
ALT: 17 U/L (ref 0–35)
AST: 22 U/L (ref 0–37)
Albumin: 4.2 g/dL (ref 3.5–5.2)
Alkaline Phosphatase: 51 U/L (ref 39–117)
Bilirubin, Direct: 0.1 mg/dL (ref 0.0–0.3)
Total Bilirubin: 0.6 mg/dL (ref 0.2–1.2)
Total Protein: 6.6 g/dL (ref 6.0–8.3)

## 2021-10-14 LAB — LIPID PANEL
Cholesterol: 207 mg/dL — ABNORMAL HIGH (ref 0–200)
HDL: 73.4 mg/dL (ref >39.00)
LDL Cholesterol: 117 mg/dL — ABNORMAL HIGH (ref 0–99)
NonHDL: 133.61
Total CHOL/HDL Ratio: 3
Triglycerides: 81 mg/dL (ref 0.0–149.0)
VLDL: 16.2 mg/dL (ref 0.0–40.0)

## 2021-10-14 LAB — CBC WITH DIFFERENTIAL/PLATELET
Basophils Absolute: 0 10*3/uL (ref 0.0–0.1)
Basophils Relative: 1.1 % (ref 0.0–3.0)
Eosinophils Absolute: 0.1 10*3/uL (ref 0.0–0.7)
Eosinophils Relative: 2 % (ref 0.0–5.0)
HCT: 37.3 % (ref 36.0–46.0)
Hemoglobin: 12.5 g/dL (ref 12.0–15.0)
Lymphocytes Relative: 29.3 % (ref 12.0–46.0)
Lymphs Abs: 1.3 10*3/uL (ref 0.7–4.0)
MCHC: 33.4 g/dL (ref 30.0–36.0)
MCV: 94 fl (ref 78.0–100.0)
Monocytes Absolute: 0.4 10*3/uL (ref 0.1–1.0)
Monocytes Relative: 9.5 % (ref 3.0–12.0)
Neutro Abs: 2.7 10*3/uL (ref 1.4–7.7)
Neutrophils Relative %: 58.1 % (ref 43.0–77.0)
Platelets: 200 10*3/uL (ref 150.0–400.0)
RBC: 3.97 Mil/uL (ref 3.87–5.11)
RDW: 13.2 % (ref 11.5–15.5)
WBC: 4.6 10*3/uL (ref 4.0–10.5)

## 2021-10-14 LAB — T4, FREE: Free T4: 0.67 ng/dL (ref 0.60–1.60)

## 2021-10-14 LAB — TSH: TSH: 4.08 u[IU]/mL (ref 0.35–5.50)

## 2021-10-14 NOTE — Patient Instructions (Signed)
Good to see you today . Labs  Let us know if you want to do 99$ self pay ct calcium score .  Rsv vaccine in fall

## 2021-10-15 NOTE — Progress Notes (Signed)
Lipids better than last year   lpa pending Still low  10 year  event risk  The 10-year ASCVD risk score (Arnett DK, et al., 2019) is: 3.4%   Values used to calculate the score:     Age: 66 years     Sex: Female     Is Non-Hispanic African American: No     Diabetic: No     Tobacco smoker: No     Systolic Blood Pressure: 98 mmHg     Is BP treated: No     HDL Cholesterol: 73.4 mg/dL     Total Cholesterol: 207 mg/dL

## 2021-10-20 LAB — LIPOPROTEIN A (LPA): Lipoprotein (a): 19 nmol/L (ref <75)

## 2021-10-23 DIAGNOSIS — M545 Low back pain, unspecified: Secondary | ICD-10-CM | POA: Diagnosis not present

## 2021-10-24 ENCOUNTER — Other Ambulatory Visit (HOSPITAL_COMMUNITY): Payer: Self-pay

## 2021-10-24 MED ORDER — MELOXICAM 15 MG PO TABS
15.0000 mg | ORAL_TABLET | Freq: Every day | ORAL | 0 refills | Status: DC
  Filled 2021-10-24: qty 30, 30d supply, fill #0

## 2021-10-24 MED ORDER — METHYLPREDNISOLONE 4 MG PO TBPK
ORAL_TABLET | ORAL | 0 refills | Status: DC
  Filled 2021-10-24: qty 21, 6d supply, fill #0

## 2021-10-24 NOTE — Progress Notes (Signed)
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 Has no question.

## 2021-10-28 NOTE — Progress Notes (Signed)
Optimal LPa level  no  increase cv risk  with this marker level

## 2022-01-30 ENCOUNTER — Other Ambulatory Visit: Payer: Self-pay | Admitting: Internal Medicine

## 2022-01-30 DIAGNOSIS — Z1231 Encounter for screening mammogram for malignant neoplasm of breast: Secondary | ICD-10-CM

## 2022-02-02 ENCOUNTER — Other Ambulatory Visit (HOSPITAL_BASED_OUTPATIENT_CLINIC_OR_DEPARTMENT_OTHER): Payer: Self-pay | Admitting: Internal Medicine

## 2022-02-02 DIAGNOSIS — Z1231 Encounter for screening mammogram for malignant neoplasm of breast: Secondary | ICD-10-CM

## 2022-02-04 ENCOUNTER — Ambulatory Visit: Payer: 59

## 2022-02-05 ENCOUNTER — Ambulatory Visit (HOSPITAL_BASED_OUTPATIENT_CLINIC_OR_DEPARTMENT_OTHER)
Admission: RE | Admit: 2022-02-05 | Discharge: 2022-02-05 | Disposition: A | Discharge status: Home / Self Care | Payer: 59 | Source: Ambulatory Visit | Attending: Internal Medicine | Admitting: Internal Medicine

## 2022-02-05 DIAGNOSIS — Z1231 Encounter for screening mammogram for malignant neoplasm of breast: Secondary | ICD-10-CM | POA: Diagnosis not present

## 2022-09-11 DIAGNOSIS — L814 Other melanin hyperpigmentation: Secondary | ICD-10-CM | POA: Diagnosis not present

## 2022-09-11 DIAGNOSIS — D2239 Melanocytic nevi of other parts of face: Secondary | ICD-10-CM | POA: Diagnosis not present

## 2022-09-11 DIAGNOSIS — L578 Other skin changes due to chronic exposure to nonionizing radiation: Secondary | ICD-10-CM | POA: Diagnosis not present

## 2022-09-11 DIAGNOSIS — Z86018 Personal history of other benign neoplasm: Secondary | ICD-10-CM | POA: Diagnosis not present

## 2022-09-11 DIAGNOSIS — D485 Neoplasm of uncertain behavior of skin: Secondary | ICD-10-CM | POA: Diagnosis not present

## 2022-09-11 DIAGNOSIS — D2271 Melanocytic nevi of right lower limb, including hip: Secondary | ICD-10-CM | POA: Diagnosis not present

## 2022-09-11 DIAGNOSIS — Z85828 Personal history of other malignant neoplasm of skin: Secondary | ICD-10-CM | POA: Diagnosis not present

## 2022-09-11 DIAGNOSIS — L821 Other seborrheic keratosis: Secondary | ICD-10-CM | POA: Diagnosis not present

## 2022-09-11 DIAGNOSIS — D225 Melanocytic nevi of trunk: Secondary | ICD-10-CM | POA: Diagnosis not present

## 2022-12-28 ENCOUNTER — Other Ambulatory Visit (HOSPITAL_BASED_OUTPATIENT_CLINIC_OR_DEPARTMENT_OTHER): Payer: Self-pay

## 2022-12-28 MED ORDER — COVID-19 MRNA VAC-TRIS(PFIZER) 30 MCG/0.3ML IM SUSY
0.3000 mL | PREFILLED_SYRINGE | Freq: Once | INTRAMUSCULAR | 0 refills | Status: AC
Start: 2022-12-28 — End: 2022-12-29
  Filled 2022-12-28: qty 0.3, 1d supply, fill #0

## 2022-12-28 MED ORDER — INFLUENZA VAC A&B SURF ANT ADJ 0.5 ML IM SUSY
0.5000 mL | PREFILLED_SYRINGE | Freq: Once | INTRAMUSCULAR | 0 refills | Status: AC
Start: 2022-12-28 — End: 2022-12-29
  Filled 2022-12-28: qty 0.5, 1d supply, fill #0

## 2023-01-18 ENCOUNTER — Encounter: Payer: Self-pay | Admitting: Internal Medicine

## 2023-02-08 ENCOUNTER — Encounter (HOSPITAL_BASED_OUTPATIENT_CLINIC_OR_DEPARTMENT_OTHER): Payer: Self-pay | Admitting: Radiology

## 2023-02-08 ENCOUNTER — Ambulatory Visit (HOSPITAL_BASED_OUTPATIENT_CLINIC_OR_DEPARTMENT_OTHER)
Admission: RE | Admit: 2023-02-08 | Discharge: 2023-02-08 | Disposition: A | Discharge status: Home / Self Care | Payer: Medicare HMO | Source: Ambulatory Visit | Attending: Internal Medicine | Admitting: Internal Medicine

## 2023-02-08 DIAGNOSIS — Z1231 Encounter for screening mammogram for malignant neoplasm of breast: Secondary | ICD-10-CM | POA: Diagnosis not present

## 2023-02-08 DIAGNOSIS — R92313 Mammographic fatty tissue density, bilateral breasts: Secondary | ICD-10-CM | POA: Insufficient documentation

## 2023-03-09 DIAGNOSIS — M545 Low back pain, unspecified: Secondary | ICD-10-CM | POA: Diagnosis not present

## 2023-03-09 DIAGNOSIS — M25551 Pain in right hip: Secondary | ICD-10-CM | POA: Diagnosis not present

## 2023-03-10 ENCOUNTER — Other Ambulatory Visit (HOSPITAL_COMMUNITY): Payer: Self-pay

## 2023-03-10 MED ORDER — METHYLPREDNISOLONE 4 MG PO TBPK
ORAL_TABLET | ORAL | 0 refills | Status: DC
  Filled 2023-03-10: qty 42, 12d supply, fill #0

## 2023-03-10 MED ORDER — GABAPENTIN 300 MG PO CAPS
300.0000 mg | ORAL_CAPSULE | Freq: Every day | ORAL | 0 refills | Status: DC
  Filled 2023-03-10: qty 8, 4d supply, fill #0

## 2023-04-07 NOTE — Progress Notes (Unsigned)
No chief complaint on file.   HPI: Patient  Peggy Harrington  68 y.o. comes in today for Preventive Health Care visit   Retired now for a year   Health Maintenance  Topic Date Due   Medicare Annual Wellness (AWV)  Never done   Colonoscopy  02/09/2023   COVID-19 Vaccine (6 - 2024-25 season) 02/22/2023   MAMMOGRAM  02/07/2025   DTaP/Tdap/Td (4 - Td or Tdap) 03/11/2030   Pneumonia Vaccine 25+ Years old  Completed   INFLUENZA VACCINE  Completed   DEXA SCAN  Completed   Hepatitis C Screening  Completed   Zoster Vaccines- Shingrix  Completed   HPV VACCINES  Aged Out   Health Maintenance Review LIFESTYLE:  Exercise:  could do better   had fall  ice  back  Tobacco/ETS:n Alcohol:  ocass   Sugar beverages: low level  Sleep: about 8 hours   Drug use: no  HH of   dog  Work: retired a year  Ambien once a month or so .  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 GI /GU: No adominal pain, vomiting, change in bowel habits. No blood in the stool. No significant GU symptoms. SKIN/HEME: ,no acute skin rashes suspicious lesions or bleeding. No lymphadenopathy, nodules, masses.  NEURO/ PSYCH:  No neurologic signs such as weakness numbness. No depression anxiety. IMM/ Allergy: No unusual infections.  Allergy .   REST of 12 system review negative except as per HPI   Past Medical History:  Diagnosis Date   Allergic rhinitis    Osteoarthritis    Osteopenia    PPD screening test    negative     Past Surgical History:  Procedure Laterality Date   BREAST REDUCTION SURGERY     BREAST REDUCTION SURGERY  2015   COLONOSCOPY  1999   left wrist surgery     REFRACTIVE SURGERY  2004    Family History  Problem Relation Age of Onset   Colon polyps Brother     Coronary artery disease Brother    Hypertension Mother    Colon cancer Mother    Goiter Sister    Colon polyps Sister    Coronary artery disease Other    Heart disease Father     Social History   Socioeconomic History   Marital status: Single    Spouse name: Not on file   Number of children: Not on file   Years of education: Not on file   Highest education level: Master's degree (e.g., MA, MS, MEng, MEd, MSW, MBA)  Occupational History   Not on file  Tobacco Use   Smoking status: Never   Smokeless tobacco: Never  Substance and Sexual Activity   Alcohol use: Yes    Alcohol/week: 5.0 standard drinks of alcohol    Types: 5 Cans of beer per week   Drug use: No   Sexual activity: Not on file  Other Topics Concern   Not on file  Social History Narrative   hh  Of 1    Pet cats   No ets   Employed: PA   Social Drivers of Corporate investment banker Strain: Low Risk  (04/07/2023)   Overall Financial Resource Strain (CARDIA)    Difficulty of Paying Living  Expenses: Not hard at all  Food Insecurity: No Food Insecurity (04/07/2023)   Hunger Vital Sign    Worried About Running Out of Food in the Last Year: Never true    Ran Out of Food in the Last Year: Never true  Transportation Needs: No Transportation Needs (04/07/2023)   PRAPARE - Administrator, Civil Service (Medical): No    Lack of Transportation (Non-Medical): No  Physical Activity: Sufficiently Active (04/07/2023)   Exercise Vital Sign    Days of Exercise per Week: 3 days    Minutes of Exercise per Session: 60 min  Stress: Stress Concern Present (04/07/2023)   [employer] of Occupational Health - Occupational Stress Questionnaire    Feeling of Stress : To some extent  Social Connections: Socially Isolated (04/07/2023)   Social Connection and Isolation Panel [NHANES]    Frequency of Communication with Friends and Family: Twice a week    Frequency of Social Gatherings with Friends and Family: Once a  week    Attends Religious Services: Never    Database administrator or Organizations: No    Attends Engineer, structural: Not on file    Marital Status: Never married    Outpatient Medications Prior to Visit  Medication Sig Dispense Refill   clobetasol cream (TEMOVATE) 0.05 % Apply 1 application topically 2 (two) times daily. As needed for  poison ivy 30 g 0   COVID-19 mRNA bivalent vaccine, Pfizer, (PFIZER COVID-19 VAC BIVALENT) injection Inject into the muscle. (Patient not taking: Reported on 10/14/2021) 0.3 mL 0   gabapentin (NEURONTIN) 300 MG capsule Start taking 1 capsule (300 mg total) by mouth at bedtime, can increase to 2 capsules nightly if needed. 8 capsule 0   HYDROcodone-acetaminophen (NORCO) 7.5-325 MG tablet Take 0.5-1 tablets by mouth every 4-6 hours as needed for severe pain (Patient not taking: Reported on 10/14/2021) 6 tablet 0   ibuprofen (ADVIL,MOTRIN) 200 MG tablet Take 200 mg by mouth every 6 (six) hours as needed. Reported on 04/01/2015     ketorolac (TORADOL) 10 MG tablet Take 1 tablet (10 mg total) by mouth 4 (four) times daily. Start six hours after IV injection. IV injection will be given in office prior to surgery. (Patient not taking: Reported on 10/14/2021) 16 tablet 0   Melatonin 5 MG TABS Take 1 tablet by mouth at bedtime as needed.     meloxicam (MOBIC) 15 MG tablet Take 1 tablet (15 mg total) by mouth daily. 30 tablet 0   methylPREDNISolone (MEDROL) 4 MG TBPK tablet Follow enclosed instructions 21 tablet 0   methylPREDNISolone (MEDROL) 4 MG TBPK tablet Take 6 tablets 1st and 2nd day, 5 tablets 3rd and 4th day, 4 tablets 5th and 6th day, 3 tablets 7th and 8th day, 2 tablets 9th and 10th day, 1 tablet the 11th and 12th day 42 tablet 0   predniSONE (DELTASONE) 10 MG tablet Take 6 tablets by mouth daily for 3 days then 4 tablets daily for 3 days then 2 tablets daily for 3 days then 1 tablet daily for 3 days  for poison ivy (6-6-6-4-4-4-2-2-2-1-1-1) (Patient not  taking: Reported on 10/14/2021) 40 tablet 0   zolpidem (AMBIEN) 5 MG tablet Take 1 tablet (5 mg total) by mouth at bedtime as needed for sleep. 20 tablet 1   No facility-administered medications prior to visit.     EXAM:  There were no vitals taken for this visit.  There is no height or weight on  file to calculate BMI. Wt Readings from Last 3 Encounters:  10/14/21 132 lb 3.2 oz (60 kg)  09/02/20 133 lb (60.3 kg)  02/09/20 149 lb (67.6 kg)    Physical Exam: Vital signs reviewed NWG:NFAO is a well-developed well-nourished alert cooperative    who appearsr stated age in no acute distress.  HEENT: normocephalic atraumatic , Eyes: PERRL EOM's full, conjunctiva clear, Nares: paten,t no deformity discharge or tenderness., Ears: no deformity EAC's clear TMs with normal landmarks. Mouth: clear OP, no lesions, edema.  Moist mucous membranes. Dentition in adequate repair. NECK: supple without masses, thyromegaly or bruits. CHEST/PULM:  Clear to auscultation and percussion breath sounds equal no wheeze , rales or rhonchi. No chest wall deformities or tenderness. Breast: normal by inspection . No dimpling, discharge, masses, tenderness or discharge . CV: PMI is nondisplaced, S1 S2 no gallops, murmurs, rubs. Peripheral pulses are full without delay.No JVD .  ABDOMEN: Bowel sounds normal nontender  No guard or rebound, no hepato splenomegal no CVA tenderness.  No hernia. Extremtities:  No clubbing cyanosis or edema, no acute joint swelling or redness no focal atrophy NEURO:  Oriented x3, cranial nerves 3-12 appear to be intact, no obvious focal weakness,gait within normal limits no abnormal reflexes or asymmetrical SKIN: No acute rashes normal turgor, color, no bruising or petechiae. PSYCH: Oriented, good eye contact, no obvious depression anxiety, cognition and judgment appear normal. LN: no cervical axillary inguinal adenopathy  Lab Results  Component Value Date   WBC 4.6 10/14/2021   HGB 12.5  10/14/2021   HCT 37.3 10/14/2021   PLT 200.0 10/14/2021   GLUCOSE 86 10/14/2021   CHOL 207 (H) 10/14/2021   TRIG 81.0 10/14/2021   HDL 73.40 10/14/2021   LDLDIRECT 118.0 08/18/2012   LDLCALC 117 (H) 10/14/2021   ALT 17 10/14/2021   AST 22 10/14/2021   NA 139 10/14/2021   K 4.5 10/14/2021   CL 105 10/14/2021   CREATININE 0.90 10/14/2021   BUN 14 10/14/2021   CO2 25 10/14/2021   TSH 4.08 10/14/2021    BP Readings from Last 3 Encounters:  10/14/21 98/60  09/02/20 118/60  02/09/20 (!) 108/57    Lab plan reviewed with patient   ASSESSMENT AND PLAN:  Discussed the following assessment and plan:    ICD-10-CM   1. Visit for preventive health examination  Z00.00      No follow-ups on file.  Patient Care Team: Pleasant Britz, Neta Mends, MD as PCP - General Carrington Clamp, MD as Attending Physician (Obstetrics and Gynecology) There are no Patient Instructions on file for this visit.  Neta Mends. Jerzy Roepke M.D.

## 2023-04-08 ENCOUNTER — Encounter: Payer: Self-pay | Admitting: Internal Medicine

## 2023-04-08 ENCOUNTER — Ambulatory Visit
Admission: RE | Admit: 2023-04-08 | Discharge: 2023-04-08 | Disposition: A | Discharge status: Home / Self Care | Payer: Medicare HMO | Source: Ambulatory Visit | Attending: Internal Medicine | Admitting: Internal Medicine

## 2023-04-08 ENCOUNTER — Other Ambulatory Visit (HOSPITAL_COMMUNITY): Payer: Self-pay

## 2023-04-08 ENCOUNTER — Ambulatory Visit (INDEPENDENT_AMBULATORY_CARE_PROVIDER_SITE_OTHER): Payer: Medicare HMO | Admitting: Internal Medicine

## 2023-04-08 VITALS — BP 120/80 | HR 62 | Temp 97.6°F | Ht 66.0 in | Wt 140.2 lb

## 2023-04-08 DIAGNOSIS — E7800 Pure hypercholesterolemia, unspecified: Secondary | ICD-10-CM | POA: Diagnosis not present

## 2023-04-08 DIAGNOSIS — N958 Other specified menopausal and perimenopausal disorders: Secondary | ICD-10-CM | POA: Diagnosis not present

## 2023-04-08 DIAGNOSIS — Z0000 Encounter for general adult medical examination without abnormal findings: Secondary | ICD-10-CM

## 2023-04-08 DIAGNOSIS — Z8249 Family history of ischemic heart disease and other diseases of the circulatory system: Secondary | ICD-10-CM | POA: Diagnosis not present

## 2023-04-08 DIAGNOSIS — E2839 Other primary ovarian failure: Secondary | ICD-10-CM

## 2023-04-08 DIAGNOSIS — M8588 Other specified disorders of bone density and structure, other site: Secondary | ICD-10-CM | POA: Diagnosis not present

## 2023-04-08 LAB — BASIC METABOLIC PANEL
BUN: 18 mg/dL (ref 6–23)
CO2: 29 meq/L (ref 19–32)
Calcium: 9.3 mg/dL (ref 8.4–10.5)
Chloride: 104 meq/L (ref 96–112)
Creatinine, Ser: 0.83 mg/dL (ref 0.40–1.20)
GFR: 72.64 mL/min (ref >60.00)
Glucose, Bld: 95 mg/dL (ref 70–99)
Potassium: 4.2 meq/L (ref 3.5–5.1)
Sodium: 140 meq/L (ref 135–145)

## 2023-04-08 LAB — LIPID PANEL
Cholesterol: 244 mg/dL — ABNORMAL HIGH (ref 0–200)
HDL: 81.9 mg/dL (ref >39.00)
LDL Cholesterol: 148 mg/dL — ABNORMAL HIGH (ref 0–99)
NonHDL: 161.82
Total CHOL/HDL Ratio: 3
Triglycerides: 68 mg/dL (ref 0.0–149.0)
VLDL: 13.6 mg/dL (ref 0.0–40.0)

## 2023-04-08 MED ORDER — ZOLPIDEM TARTRATE 5 MG PO TABS
5.0000 mg | ORAL_TABLET | Freq: Every evening | ORAL | 1 refills | Status: AC | PRN
  Filled 2023-04-08: qty 20, 20d supply, fill #0

## 2023-04-08 NOTE — Patient Instructions (Signed)
Good to see you today   Dexa scan update. Continue lifestyle intervention healthy eating and exercise .  Lipid and bmp today . Will order  ct calcium score self [facility] at [facility]   they should contact  you

## 2023-04-09 ENCOUNTER — Encounter: Payer: Self-pay | Admitting: Internal Medicine

## 2023-04-09 NOTE — Progress Notes (Signed)
Dexa shows  low bone density. (T score -2.1) Optimize  weight bearing exercise and resistance training adequate diet  vit d  1000 per day average  and suggest repeat in  2-3 years. To assess stability .

## 2023-04-09 NOTE — Progress Notes (Signed)
LDL up from last year   Lets see what the ct calcium score shows for Cv risk assessment

## 2023-05-24 ENCOUNTER — Ambulatory Visit (HOSPITAL_BASED_OUTPATIENT_CLINIC_OR_DEPARTMENT_OTHER)
Admission: RE | Admit: 2023-05-24 | Discharge: 2023-05-24 | Disposition: A | Discharge status: Home / Self Care | Payer: Medicare HMO | Source: Ambulatory Visit | Attending: Internal Medicine | Admitting: Internal Medicine

## 2023-05-24 DIAGNOSIS — Z8249 Family history of ischemic heart disease and other diseases of the circulatory system: Secondary | ICD-10-CM | POA: Insufficient documentation

## 2023-05-24 DIAGNOSIS — E7800 Pure hypercholesterolemia, unspecified: Secondary | ICD-10-CM | POA: Insufficient documentation

## 2023-05-25 ENCOUNTER — Encounter: Payer: Self-pay | Admitting: Internal Medicine

## 2023-05-25 NOTE — Progress Notes (Signed)
 Ct calcium score 0   low risk favorable

## 2023-05-27 NOTE — Telephone Encounter (Signed)
 It is always an option  to add medication  but not compelling   The 10-year ASCVD risk score (Arnett DK, et al., 2019) is: 6.6%   Values used to calculate the score:     Age: 68 years     Sex: Female     Is Non-Hispanic African American: No     Diabetic: No     Tobacco smoker: No     Systolic Blood Pressure: 120 mmHg     Is BP treated: No     HDL Cholesterol: 81.9 mg/dL     Total Cholesterol: 244 mg/dL

## 2023-05-27 NOTE — Progress Notes (Signed)
 It is always an option  to add medication  but not compelling   The 10-year ASCVD risk score (Arnett DK, et al., 2019) is: 6.6%   Values used to calculate the score:     Age: 68 years     Sex: Female     Is Non-Hispanic African American: No     Diabetic: No     Tobacco smoker: No     Systolic Blood Pressure: 120 mmHg     Is BP treated: No     HDL Cholesterol: 81.9 mg/dL     Total Cholesterol: 244 mg/dL

## 2023-06-14 ENCOUNTER — Telehealth: Payer: Self-pay

## 2023-06-14 NOTE — Telephone Encounter (Signed)
 Copied from CRM [id number]. Topic: General - Other >> Jun 14, 2023  1:44 PM Dyann Glaser G wrote: Reason for CRM: PT RETURNING ROBIN'S CALL, I LEFT A MESSAGE FOR ROBIN ADVSING PT WAS RETURNING CALL.

## 2023-09-14 DIAGNOSIS — C44719 Basal cell carcinoma of skin of left lower limb, including hip: Secondary | ICD-10-CM | POA: Diagnosis not present

## 2023-09-14 DIAGNOSIS — Z86018 Personal history of other benign neoplasm: Secondary | ICD-10-CM | POA: Diagnosis not present

## 2023-09-14 DIAGNOSIS — D485 Neoplasm of uncertain behavior of skin: Secondary | ICD-10-CM | POA: Diagnosis not present

## 2023-09-14 DIAGNOSIS — L309 Dermatitis, unspecified: Secondary | ICD-10-CM | POA: Diagnosis not present

## 2023-09-14 DIAGNOSIS — D225 Melanocytic nevi of trunk: Secondary | ICD-10-CM | POA: Diagnosis not present

## 2023-09-14 DIAGNOSIS — L578 Other skin changes due to chronic exposure to nonionizing radiation: Secondary | ICD-10-CM | POA: Diagnosis not present

## 2023-09-14 DIAGNOSIS — Z85828 Personal history of other malignant neoplasm of skin: Secondary | ICD-10-CM | POA: Diagnosis not present

## 2023-09-14 DIAGNOSIS — L814 Other melanin hyperpigmentation: Secondary | ICD-10-CM | POA: Diagnosis not present

## 2023-09-14 DIAGNOSIS — L821 Other seborrheic keratosis: Secondary | ICD-10-CM | POA: Diagnosis not present

## 2023-09-29 DIAGNOSIS — C44719 Basal cell carcinoma of skin of left lower limb, including hip: Secondary | ICD-10-CM | POA: Diagnosis not present

## 2023-10-13 ENCOUNTER — Other Ambulatory Visit: Payer: Self-pay | Admitting: Medical Genetics

## 2023-10-19 ENCOUNTER — Ambulatory Visit (INDEPENDENT_AMBULATORY_CARE_PROVIDER_SITE_OTHER): Admitting: Family Medicine

## 2023-10-19 DIAGNOSIS — Z0000 Encounter for general adult medical examination without abnormal findings: Secondary | ICD-10-CM

## 2023-10-19 NOTE — Progress Notes (Signed)
 PATIENT CHECK-IN and HEALTH RISK ASSESSMENT QUESTIONNAIRE:  -completed by phone/video for upcoming Medicare Preventive Visit  Pre-Visit Check-in: 1)Vitals (height, wt, BP, etc) - record in vitals section for visit on day of visit Request home vitals (wt, BP, etc.) and enter into vitals, THEN update Vital Signs SmartPhrase below at the top of the HPI. See below.  2)Review and Update Medications, Allergies PMH, Surgeries, Social history in Epic 3)Hospitalizations in the last year with date/reason? No   4)Review and Update Care Team (patient's specialists) in Epic 5) Complete PHQ9 in Epic  6) Complete Fall Screening in Epic 7)Review all Health Maintenance Due and order if not done.  Medicare Wellness Patient Questionnaire:  Answer theses question about your habits: How often do you have a drink containing alcohol?2 week  How many drinks containing alcohol do you have on a typical day when you are drinking?1 drink  How often do you have six or more drinks on one occasion?Never  Have you ever smoked?no  Quit date if applicable? Na   How many packs a day do/did you smoke? Na  Do you use smokeless tobacco?Na  Do you use an illicit drugs?No  On average, how many days per week do you engage in moderate to strenuous exercise (like a brisk walk)?Yes, 3 days per week  On average, how many minutes do you engage in exercise at this level?45 minutes, walking, balance and strength training.  Are you sexually active? No Number of partners?Na  Typical diet: does not eat much meat or fried or fast food. Typical breakfast: None  Typical lunch:Sandwich  Typical dinner:Salad  Typical snacks: Cookies fruit   Beverages: water, coffee  Good social connections.   Answer theses question about your everyday activities: Can you perform most household chores?Yes  Are you deaf or have significant trouble hearing? Yes, both  Do you feel that you have a problem with memory?No  Do you feel safe at home?yes   Last dentist visit?June 2025 8. Do you have any difficulty performing your everyday activities? No  Are you having any difficulty walking, taking medications on your own, and or difficulty managing daily home needs?No  Do you have difficulty walking or climbing stairs?NO  Do you have difficulty dressing or bathing?NO  Do you have difficulty doing errands alone such as visiting a doctor's office or shopping?NO  Do you currently have any difficulty preparing food and eating?No  Do you currently have any difficulty using the toilet?No  Do you have any difficulty managing your finances?NO  Do you have any difficulties with housekeeping of managing your housekeeping?NO    Do you have Advanced Directives in place (Living Will, Healthcare Power or Attorney)? Yes    Last eye Exam and location?2023, Dr. Madelyn   Do you currently use prescribed or non-prescribed narcotic or opioid pain medications?No   Do you have a history or close family history of breast, ovarian, tubal or peritoneal cancer or a family member with BRCA (breast cancer susceptibility 1 and 2) gene mutations? Cousin, has Ovarian Cancer    Nurse/Assistant Credentials/time stamp: Peggy Harrington CMA 12:44 pm     ----------------------------------------------------------------------------------------------------------------------------------------------------------------------------------------------------------------------  Because this visit was a virtual/telehealth visit, some criteria may be missing or patient reported. Any vitals not documented were not able to be obtained and vitals that have been documented are patient reported.    MEDICARE ANNUAL PREVENTIVE VISIT WITH PROVIDER: (Welcome to Medicare, initial annual wellness or annual wellness exam)  Virtual Visit via Video Note  I  connected with Peggy Harrington on 10/19/23 by a video enabled telemedicine application and verified that I am speaking with the correct  person using two identifiers.  Location patient: home Location provider:work or home office Persons participating in the virtual visit: patient, provider  Concerns and/or follow up today: fairly stable, has chronic low back issues with oa. Had a fall earlier this year - was evaluated with ortho and is now is recovered. Was walking her dog at night and missed a curb and was slippery. Feels like balance is usually pretty good.    See HM section in Epic for other details of completed HM.    ROS: negative for report of fevers, unintentional weight loss, vision changes, vision loss, hearing loss or change, chest pain, sob, hemoptysis, melena, hematochezia, hematuria, falls, bleeding or bruising, thoughts of suicide or self harm, memory loss  Patient-completed extensive health risk assessment - reviewed and discussed with the patient: See Health Risk Assessment completed with patient prior to the visit either above or in recent phone note. This was reviewed in detailed with the patient today and appropriate recommendations, orders and referrals were placed as needed per Summary below and patient instructions.   Review of Medical History: -PMH, PSH, Family History and current specialty and care providers reviewed and updated and listed below   Patient Care Team: Panosh, Apolinar POUR, MD as PCP - General Sarrah Browning, MD as Attending Physician (Obstetrics and Gynecology)   Past Medical History:  Diagnosis Date   Allergic rhinitis    Osteoarthritis    Osteopenia    PPD screening test    negative     Past Surgical History:  Procedure Laterality Date   BREAST REDUCTION SURGERY     BREAST REDUCTION SURGERY  2015   COLONOSCOPY  1999   left wrist surgery     REFRACTIVE SURGERY  2004    Social History   Socioeconomic History   Marital status: Single    Spouse name: Not on file   Number of children: Not on file   Years of education: Not on file   Highest education level: Master's  degree (e.g., MA, MS, MEng, MEd, MSW, MBA)  Occupational History   Not on file  Tobacco Use   Smoking status: Never   Smokeless tobacco: Never  Substance and Sexual Activity   Alcohol use: Yes    Alcohol/week: 5.0 standard drinks of alcohol    Types: 5 Cans of beer per week   Drug use: No   Sexual activity: Not on file  Other Topics Concern   Not on file  Social History Narrative   hh  Of 1    Pet cats   No ets   Employed: PA   Social Drivers of Corporate investment banker Strain: Low Risk  (10/19/2023)   Overall Financial Resource Strain (CARDIA)    Difficulty of Paying Living Expenses: Not hard at all  Food Insecurity: No Food Insecurity (10/19/2023)   Hunger Vital Sign    Worried About Running Out of Food in the Last Year: Never true    Ran Out of Food in the Last Year: Never true  Transportation Needs: No Transportation Needs (10/19/2023)   PRAPARE - Administrator, Civil Service (Medical): No    Lack of Transportation (Non-Medical): No  Physical Activity: Insufficiently Active (10/19/2023)   Exercise Vital Sign    Days of Exercise per Week: 2 days    Minutes of Exercise per Session: 50  min  Stress: Stress Concern Present (10/19/2023)   [employer] of Occupational Health - Occupational Stress Questionnaire    Feeling of Stress: Rather much  Social Connections: Socially Isolated (10/19/2023)   Social Connection and Isolation Panel    Frequency of Communication with Friends and Family: Once a week    Frequency of Social Gatherings with Friends and Family: Once a week    Attends Religious Services: Never    Database administrator or Organizations: No    Attends Engineer, structural: Not on file    Marital Status: Never married  Catering manager Violence: Not on file    Family History  Problem Relation Age of Onset   Colon polyps Brother    Coronary artery disease Brother    Hypertension Mother    Colon cancer Mother    Goiter Sister     Colon polyps Sister    Coronary artery disease Other    Heart disease Father     Current Outpatient Medications on File Prior to Visit  Medication Sig Dispense Refill   clobetasol  cream (TEMOVATE ) 0.05 % Apply 1 application topically 2 (two) times daily. As needed for  poison ivy 30 g 0   COVID-19 mRNA bivalent vaccine, Pfizer, (PFIZER COVID-19 VAC BIVALENT) injection Inject into the muscle. 0.3 mL 0   ibuprofen (ADVIL,MOTRIN) 200 MG tablet Take 200 mg by mouth every 6 (six) hours as needed. Reported on 04/01/2015     Melatonin 5 MG TABS Take 1 tablet by mouth at bedtime as needed.     Vitamin D, Cholecalciferol, 25 MCG (1000 UT) CAPS      zolpidem  (AMBIEN ) 5 MG tablet Take 1 tablet (5 mg total) by mouth at bedtime as needed for sleep. 20 tablet 1   No current facility-administered medications on file prior to visit.    No Known Allergies     Physical Exam Vitals requested from patient and listed below if patient had equipment and was able to obtain at home for this virtual visit: There were no vitals filed for this visit. Estimated body mass index is 22.63 kg/m as calculated from the following:   Height as of 04/08/23: 5' 6 (1.676 m).   Weight as of 04/08/23: 140 lb 3.2 oz (63.6 kg).  EKG (optional): deferred due to virtual visit  GENERAL: alert, oriented, no acute distress detected, full vision exam deferred due to pandemic and/or virtual encounter  HEENT: atraumatic, conjunttiva clear, no obvious abnormalities on inspection of external nose and ears  NECK: normal movements of the head and neck  LUNGS: on inspection no signs of respiratory distress, breathing rate appears normal, no obvious gross SOB, gasping or wheezing  CV: no obvious cyanosis  MS: moves all visible extremities without noticeable abnormality  PSYCH/NEURO: pleasant and cooperative, no obvious depression or anxiety, speech and thought processing grossly intact, Cognitive function grossly intact  Flowsheet  Row Office Visit [facility]/08/20[facility]  PHQ-9 Total Score 5        10/19/2023   12:33 PM 04/08/2023    9:21 AM 10/14/2021   11:04 AM 09/02/2020    2:51 PM  Depression screen PHQ 2/9  Decreased Interest 1 3 1    Down, Depressed, Hopeless 1 1 1 1   PHQ - 2 Score 2 4 2 1   Altered sleeping  1 0 1  Tired, decreased energy 1 0 0 2  Change in appetite 0 0 0 0  Feeling bad or  failure about yourself  0 0  0  Trouble concentrating 0 0 0 0  Moving slowly or fidgety/restless 0 0 0 0  Suicidal thoughts 0 0 0 0  PHQ-9 Score  5 2 4   Difficult doing work/chores  Somewhat difficult Not difficult at all        10/14/2021   10:54 AM 04/08/2023    9:20 AM 10/19/2023    9:40 AM 10/19/2023   12:33 PM  Fall Risk  Falls in the past year? 0 1 1 1   Was there an injury with Fall? 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 in December -discussed vaccines due, she plans to do Health Maintenance  Topic Date Due   INFLUENZA VACCINE  10/24/2023 (Originally 09/24/2023)   COVID-19 Vaccine (6 - Pfizer risk 2024-25 season) 11/04/2023 (Originally 06/27/2023)   Colonoscopy  01/06/2024 (Originally 02/09/2023)   Medicare Annual Wellness (AWV)  10/18/2024   MAMMOGRAM  02/07/2025   DTaP/Tdap/Td (4 - Td or Tdap) 03/11/2030   Pneumococcal Vaccine: 50+ Years   Completed   DEXA SCAN  Completed   Hepatitis C Screening  Completed   Zoster Vaccines- Shingrix  Completed   HPV VACCINES  Aged Out   Meningococcal B Vaccine  Aged Out      Education and counseling on the following was provided based on the above review of health and a plan/checklist for the patient, along with additional information discussed, was provided for the patient in the patient instructions :  -Provided counseling and plan for increased risk of falling if applicable per above screening. Provided safe balance exercises that can be done at home to improve balance and discussed exercise guidelines for adults with include balance exercises at least 3 days per week. See patient instructions.  -Advised and counseled on a healthy lifestyle - including the importance of a healthy diet, regular physical activity, social connections  -depression symptoms related to current situations and feels has good social support -Reviewed patient's current diet. Advised and counseled on a whole foods based healthy diet. Discussed ways to ensure adequate protein while avoiding meat. A summary of a healthy diet was provided in the Patient Instructions.  -reviewed patient's current physical activity level and discussed exercise guidelines for adults. Congratulated on current healthy choices. Discussed community resources and provided further resources/ideas for safe exercise at home to assist in meeting exercise guideline recommendations in a safe and healthy way.  -Advise yearly dental visits at minimum and regular eye exams   Follow up: see patient instructions     Patient Instructions  I really enjoyed getting to talk with you today! I am available on Tuesdays and Thursdays for virtual visits if you have any questions or concerns, or if I can be of any further assistance.   CHECKLIST FROM  ANNUAL WELLNESS VISIT:  -Follow up (please call to schedule if not scheduled after visit):   -yearly for  annual wellness visit with primary care office  Here is a list of your preventive care/health maintenance measures and the plan for each if any are due:  PLAN For any measures below that may be due:    1. Please schedule colonoscopy.   2. Can get vaccines at the pharmacy, please let us  know if you do so that we can update your record.   Health Maintenance  Topic Date Due   INFLUENZA VACCINE  10/24/2023 (Originally 09/24/2023)   COVID-19 Vaccine (6 - Pfizer risk 2024-25 season) 11/04/2023 (Originally 06/27/2023)   Colonoscopy  01/06/2024 (Originally 02/09/2023)   Medicare Annual Wellness (AWV)  10/18/2024   MAMMOGRAM  02/07/2025   DTaP/Tdap/Td (4 - Td or Tdap) 03/11/2030   Pneumococcal Vaccine: 50+ Years  Completed   DEXA SCAN  Completed   Hepatitis C Screening  Completed   Zoster Vaccines- Shingrix  Completed   HPV VACCINES  Aged Out   Meningococcal B Vaccine  Aged Out    -See a dentist at least yearly  -Get your eyes checked and then per your eye specialist's recommendations  -Other issues addressed today:   -I have included below further information regarding a healthy whole foods based diet, physical activity guidelines for adults, stress management and opportunities for social connections. I hope you find this information useful.   -----------------------------------------------------------------------------------------------------------------------------------------------------------------------------------------------------------------------------------------------------------    NUTRITION: -eat real food: lots of colorful vegetables (half the plate) and fruits -5-7 servings of vegetables and fruits per day (fresh or steamed is best), exp. 2 servings of vegetables with lunch and dinner and 2 servings of fruit per day. Berries and greens such as kale and collards are great choices.  -consume on a regular basis:  fresh fruits, fresh veggies, fish, nuts, seeds, healthy oils  (such as olive oil, avocado oil), whole grains (make sure for bread/pasta/crackers/etc., that the first ingredient on label contains the word whole), legumes. -can eat small amounts of dairy and lean meat (no larger than the palm of your hand), but avoid processed meats such as ham, bacon, lunch meat, etc. -drink water -try to avoid fast food and pre-packaged foods, processed meat, ultra processed foods/beverages (donuts, candy, etc.) -most experts advise limiting sodium to < 2300mg  per day, should limit further is any chronic conditions such as high blood pressure, heart disease, diabetes, etc. The American Heart Association advised that < 1500mg  is is ideal -try to avoid foods/beverages that contain any ingredients with names you do not recognize  -try to avoid foods/beverages  with added sugar or sweeteners/sweets  -try to avoid sweet drinks (including diet drinks): soda, juice, Gatorade, sweet tea, power drinks, diet drinks -try to avoid white rice, white bread, pasta (unless whole grain)  EXERCISE GUIDELINES FOR ADULTS: -if you wish to increase your physical activity, do so gradually and with the approval of your doctor -STOP and seek medical care immediately if you have any chest pain, chest discomfort or trouble breathing when starting or increasing exercise  -move and stretch your body, legs, feet and arms when sitting for long periods -Physical activity guidelines for optimal health in adults: -get at least 150 minutes per week of moderate exercise (can talk, but not sing); this is about 20-30 minutes of sustained activity 5-7 days per week or two 10-15 minute episodes of sustained activity 5-7 days per week -do some muscle building/resistance training/strength training at least 2  days per week  -balance exercises 3+ days per week:   Stand somewhere where you have something sturdy to hold onto if you lose balance    1) lift up on toes, then back down, start with 5x per day and work up  to 20x   2) stand and lift one leg straight out to the side so that foot is a few inches of the floor, start with 5x each side and work up to 20x each side   3) stand on one foot, start with 5 seconds each side and work up to 20 seconds on each side  If you need ideas or help with getting more active:  -Silver sneakers https://tools.silversneakers.com  -Walk with a Doc: http://www.duncan-williams.com/  -try to include resistance (weight lifting/strength building) and balance exercises twice per week: or the following link for ideas: http://castillo-powell.com/  BuyDucts.dk  STRESS MANAGEMENT: -can try meditating, or just sitting quietly with deep breathing while intentionally relaxing all parts of your body for 5 minutes daily -if you need further help with stress, anxiety or depression please follow up with your primary doctor or contact the wonderful folks at [facility]: [phone]  SOCIAL CONNECTIONS: -options in [place] if you wish to engage in more social and exercise related activities:  -Silver sneakers https://tools.silversneakers.com  -Walk with a Doc: http://www.duncan-williams.com/  -Check out the [facility] Active Adults 50+ section on the [employer] (hiking clubs, book clubs, cards and games, chess, exercise classes, aquatic classes and much more) - see the website for details: https://www.[place].gov/departments/parks-recreation/active-adults50  -YouTube has lots of exercise videos for different ages and abilities as well  -[facility] (a variety of indoor and outdoor inperson activities for adults). [phone]. [address]  -Virtual Online Classes (a variety of topics): see seniorplanet.org or call [phone]  -consider volunteering at a school, hospice center, church, senior center or  elsewhere            Chiquita JONELLE Cramp, DO

## 2023-10-19 NOTE — Progress Notes (Signed)
 Patient was unable to self-report due to a lack of equipment at home via telehealth

## 2023-10-19 NOTE — Patient Instructions (Signed)
 I really enjoyed getting to talk with you today! I am available on Tuesdays and Thursdays for virtual visits if you have any questions or concerns, or if I can be of any further assistance.   CHECKLIST FROM ANNUAL WELLNESS VISIT:  -Follow up (please call to schedule if not scheduled after visit):   -yearly for annual wellness visit with primary care office  Here is a list of your preventive care/health maintenance measures and the plan for each if any are due:  PLAN For any measures below that may be due:    1. Please schedule colonoscopy.   2. Can get vaccines at the pharmacy, please let us  know if you do so that we can update your record.   Health Maintenance  Topic Date Due   INFLUENZA VACCINE  10/24/2023 (Originally 09/24/2023)   COVID-19 Vaccine (6 - Pfizer risk 2024-25 season) 11/04/2023 (Originally 06/27/2023)   Colonoscopy  01/06/2024 (Originally 02/09/2023)   Medicare Annual Wellness (AWV)  10/18/2024   MAMMOGRAM  02/07/2025   DTaP/Tdap/Td (4 - Td or Tdap) 03/11/2030   Pneumococcal Vaccine: 50+ Years  Completed   DEXA SCAN  Completed   Hepatitis C Screening  Completed   Zoster Vaccines- Shingrix  Completed   HPV VACCINES  Aged Out   Meningococcal B Vaccine  Aged Out    -See a dentist at least yearly  -Get your eyes checked and then per your eye specialist's recommendations  -Other issues addressed today:   -I have included below further information regarding a healthy whole foods based diet, physical activity guidelines for adults, stress management and opportunities for social connections. I hope you find this information useful.   -----------------------------------------------------------------------------------------------------------------------------------------------------------------------------------------------------------------------------------------------------------    NUTRITION: -eat real food: lots of colorful vegetables (half the plate) and  fruits -5-7 servings of vegetables and fruits per day (fresh or steamed is best), exp. 2 servings of vegetables with lunch and dinner and 2 servings of fruit per day. Berries and greens such as kale and collards are great choices.  -consume on a regular basis:  fresh fruits, fresh veggies, fish, nuts, seeds, healthy oils (such as olive oil, avocado oil), whole grains (make sure for bread/pasta/crackers/etc., that the first ingredient on label contains the word whole), legumes. -can eat small amounts of dairy and lean meat (no larger than the palm of your hand), but avoid processed meats such as ham, bacon, lunch meat, etc. -drink water -try to avoid fast food and pre-packaged foods, processed meat, ultra processed foods/beverages (donuts, candy, etc.) -most experts advise limiting sodium to < 2300mg  per day, should limit further is any chronic conditions such as high blood pressure, heart disease, diabetes, etc. The American Heart Association advised that < 1500mg  is is ideal -try to avoid foods/beverages that contain any ingredients with names you do not recognize  -try to avoid foods/beverages  with added sugar or sweeteners/sweets  -try to avoid sweet drinks (including diet drinks): soda, juice, Gatorade, sweet tea, power drinks, diet drinks -try to avoid white rice, white bread, pasta (unless whole grain)  EXERCISE GUIDELINES FOR ADULTS: -if you wish to increase your physical activity, do so gradually and with the approval of your doctor -STOP and seek medical care immediately if you have any chest pain, chest discomfort or trouble breathing when starting or increasing exercise  -move and stretch your body, legs, feet and arms when sitting for long periods -Physical activity guidelines for optimal health in adults: -get at least 150 minutes per week of moderate exercise (  can talk, but not sing); this is about 20-30 minutes of sustained activity 5-7 days per week or two 10-15 minute episodes of  sustained activity 5-7 days per week -do some muscle building/resistance training/strength training at least 2 days per week  -balance exercises 3+ days per week:   Stand somewhere where you have something sturdy to hold onto if you lose balance    1) lift up on toes, then back down, start with 5x per day and work up to 20x   2) stand and lift one leg straight out to the side so that foot is a few inches of the floor, start with 5x each side and work up to 20x each side   3) stand on one foot, start with 5 seconds each side and work up to 20 seconds on each side  If you need ideas or help with getting more active:  -Silver sneakers https://tools.silversneakers.com  -Walk with a Doc: http://www.duncan-williams.com/  -try to include resistance (weight lifting/strength building) and balance exercises twice per week: or the following link for ideas: http://castillo-powell.com/  BuyDucts.dk  STRESS MANAGEMENT: -can try meditating, or just sitting quietly with deep breathing while intentionally relaxing all parts of your body for 5 minutes daily -if you need further help with stress, anxiety or depression please follow up with your primary doctor or contact the wonderful folks at [facility]: [phone]  SOCIAL CONNECTIONS: -options in [place] if you wish to engage in more social and exercise related activities:  -Silver sneakers https://tools.silversneakers.com  -Walk with a Doc: http://www.duncan-williams.com/  -Check out the [facility] Active Adults 50+ section on the [place] of [employer] (hiking clubs, book clubs, cards and games, chess, exercise classes, aquatic classes and much more) - see the website for details: https://www.[place].gov/departments/parks-recreation/active-adults50  -YouTube has lots of exercise videos for different ages and abilities as well  -[facility] (a variety of indoor and outdoor inperson activities for adults). [phone]. [address]  -Virtual Online Classes (a variety of topics): see seniorplanet.org or call [phone]  -consider volunteering at a school, hospice center, church, senior center or elsewhere

## 2023-10-21 ENCOUNTER — Other Ambulatory Visit

## 2023-10-21 ENCOUNTER — Telehealth: Payer: Self-pay | Admitting: Internal Medicine

## 2023-10-21 ENCOUNTER — Encounter: Payer: Self-pay | Admitting: Internal Medicine

## 2023-10-21 DIAGNOSIS — Z006 Encounter for examination for normal comparison and control in clinical research program: Secondary | ICD-10-CM

## 2023-10-21 NOTE — Telephone Encounter (Signed)
 Yes, appropriate for surveillance colonoscopy This can be scheduled in the [facility] via previsit

## 2023-10-21 NOTE — Telephone Encounter (Signed)
 Good Afternoon Dr.Pyrtle  Patient is calling wanting to know if she needs to schedule another colonoscopy. Last colonoscopy done in 2021 and it states every 3 years but patient does not have a recall and is stating she's not having any GI issues.  Please advise for future scheduling  Thank you

## 2023-10-21 NOTE — Telephone Encounter (Signed)
 Called patient and went ahead and scheduled colonoscopy for November 20th. Thank you

## 2023-10-28 ENCOUNTER — Other Ambulatory Visit

## 2023-10-29 LAB — GENECONNECT MOLECULAR SCREEN: Genetic Analysis Overall Interpretation: NEGATIVE

## 2023-11-06 ENCOUNTER — Encounter: Payer: Self-pay | Admitting: *Deleted

## 2023-12-30 ENCOUNTER — Other Ambulatory Visit (HOSPITAL_COMMUNITY): Payer: Self-pay

## 2023-12-30 ENCOUNTER — Ambulatory Visit

## 2023-12-30 VITALS — Ht 66.0 in | Wt 141.0 lb

## 2023-12-30 DIAGNOSIS — Z860100 Personal history of colon polyps, unspecified: Secondary | ICD-10-CM

## 2023-12-30 DIAGNOSIS — Z800 Family history of malignant neoplasm of digestive organs: Secondary | ICD-10-CM

## 2023-12-30 DIAGNOSIS — R110 Nausea: Secondary | ICD-10-CM

## 2023-12-30 MED ORDER — NA SULFATE-K SULFATE-MG SULF 17.5-3.13-1.6 GM/177ML PO SOLN
1.0000 | Freq: Once | ORAL | 0 refills | Status: AC
  Filled 2023-12-30: qty 354, 2d supply, fill #0

## 2023-12-30 MED ORDER — ONDANSETRON HCL 4 MG PO TABS
4.0000 mg | ORAL_TABLET | ORAL | 0 refills | Status: AC
  Filled 2023-12-30: qty 2, 2d supply, fill #0

## 2023-12-30 NOTE — Progress Notes (Signed)
 No egg or soy allergy known to patient  No issues known to pt with past sedation with any surgeries or procedures Patient denies ever being told they had issues or difficulty with intubation  No FH of Malignant Hyperthermia Pt is not on diet pills Pt is not on  home 02  Pt is not on blood thinners  Pt has intermittent issues with constipation  No A fib or A flutter Have any cardiac testing pending--No Pt can ambulate  Pt denies use of chewing tobacco Discussed diabetic I weight loss medication holds Discussed NSAID holds Checked BMI Pt instructed to use Singlecare.com or GoodRx for a price reduction on prep  Patient's chart reviewed by [name] CNRA prior to previsit and patient appropriate for the [facility].  Pre visit completed and red dot placed by patient's name on their procedure day (on provider's schedule).

## 2024-01-11 ENCOUNTER — Encounter: Payer: Self-pay | Admitting: Internal Medicine

## 2024-01-11 DIAGNOSIS — F325 Major depressive disorder, single episode, in full remission: Secondary | ICD-10-CM | POA: Diagnosis not present

## 2024-01-11 DIAGNOSIS — M8580 Other specified disorders of bone density and structure, unspecified site: Secondary | ICD-10-CM | POA: Diagnosis not present

## 2024-01-11 DIAGNOSIS — Z791 Long term (current) use of non-steroidal anti-inflammatories (NSAID): Secondary | ICD-10-CM | POA: Diagnosis not present

## 2024-01-11 DIAGNOSIS — Z8249 Family history of ischemic heart disease and other diseases of the circulatory system: Secondary | ICD-10-CM | POA: Diagnosis not present

## 2024-01-11 DIAGNOSIS — Z809 Family history of malignant neoplasm, unspecified: Secondary | ICD-10-CM | POA: Diagnosis not present

## 2024-01-11 DIAGNOSIS — M1990 Unspecified osteoarthritis, unspecified site: Secondary | ICD-10-CM | POA: Diagnosis not present

## 2024-01-11 DIAGNOSIS — Z823 Family history of stroke: Secondary | ICD-10-CM | POA: Diagnosis not present

## 2024-01-11 DIAGNOSIS — R32 Unspecified urinary incontinence: Secondary | ICD-10-CM | POA: Diagnosis not present

## 2024-01-11 DIAGNOSIS — Z85828 Personal history of other malignant neoplasm of skin: Secondary | ICD-10-CM | POA: Diagnosis not present

## 2024-01-12 ENCOUNTER — Other Ambulatory Visit (HOSPITAL_COMMUNITY): Payer: Self-pay

## 2024-01-13 ENCOUNTER — Encounter: Payer: Self-pay | Admitting: Internal Medicine

## 2024-01-13 ENCOUNTER — Ambulatory Visit: Admitting: Internal Medicine

## 2024-01-13 VITALS — BP 107/66 | HR 59 | Temp 97.3°F | Resp 15 | Ht 66.0 in | Wt 141.0 lb

## 2024-01-13 DIAGNOSIS — Z800 Family history of malignant neoplasm of digestive organs: Secondary | ICD-10-CM | POA: Diagnosis not present

## 2024-01-13 DIAGNOSIS — K5730 Diverticulosis of large intestine without perforation or abscess without bleeding: Secondary | ICD-10-CM

## 2024-01-13 DIAGNOSIS — Z1211 Encounter for screening for malignant neoplasm of colon: Secondary | ICD-10-CM

## 2024-01-13 DIAGNOSIS — Z860101 Personal history of adenomatous and serrated colon polyps: Secondary | ICD-10-CM | POA: Diagnosis not present

## 2024-01-13 DIAGNOSIS — Z860100 Personal history of colon polyps, unspecified: Secondary | ICD-10-CM

## 2024-01-13 MED ORDER — SODIUM CHLORIDE 0.9 % IV SOLN: 500.0000 mL | Freq: Once | INTRAVENOUS | Status: DC

## 2024-01-13 NOTE — Patient Instructions (Signed)

## 2024-01-13 NOTE — Progress Notes (Signed)
 GASTROENTEROLOGY PROCEDURE H&P NOTE   Primary Care Physician: Charlett Apolinar POUR, MD    Reason for Procedure:   Hx of polyps, fam hx of colon cancer   Plan:    colonoscopy  Patient is appropriate for endoscopic procedure(s) in the ambulatory ([facility]) setting.  The nature of the procedure, as well as the risks, benefits, and alternatives were carefully and thoroughly reviewed with the patient. Ample time for discussion and questions allowed.  All questions were answered. The patient understood, was satisfied, and agreed with the plan to proceed.    HPI: Peggy Harrington is a 68 y.o. female who presents for surveillance colonoscopy.  Medical history as below.  Tolerated the prep.  No recent chest pain or shortness of breath.  No abdominal pain today.  Past Medical History:  Diagnosis Date   Allergic rhinitis    Cancer (HCC)    BCC and SCC   Osteoarthritis    Osteopenia    PPD screening test    negative     Past Surgical History:  Procedure Laterality Date   BREAST REDUCTION SURGERY     BREAST REDUCTION SURGERY  2015   COLONOSCOPY  1999   left wrist surgery     REFRACTIVE SURGERY  2004    Prior to Admission medications   Medication Sig Start Date End Date Taking? Authorizing Provider  ibuprofen (ADVIL,MOTRIN) 200 MG tablet Take 200 mg by mouth every 6 (six) hours as needed. Reported on 04/01/2015   Yes [provider]  ondansetron (ZOFRAN) 4 MG tablet May take 1 tablet (4 mg total) by mouth as directed 30-40 minutes before each colonoscopy prep for nausea. 12/30/23  Yes Stephie Xu, Gordy HERO, MD  Vitamin D, Cholecalciferol, 25 MCG (1000 UT) CAPS  04/09/23  Yes [provider]  clobetasol  cream (TEMOVATE ) 0.05 % Apply 1 application topically 2 (two) times daily. As needed for  poison ivy 09/02/20   Panosh, Apolinar POUR, MD  COVID-19 mRNA bivalent vaccine, Pfizer, (PFIZER COVID-19 VAC BIVALENT) injection Inject into the muscle. 12/24/20   Luiz Channel, MD  Melatonin 5 MG TABS  Take 1 tablet by mouth at bedtime as needed.    [provider]  zolpidem  (AMBIEN ) 5 MG tablet Take 1 tablet (5 mg total) by mouth at bedtime as needed for sleep. 04/08/23   Panosh, Wanda K, MD    Current Outpatient Medications  Medication Sig Dispense Refill   ibuprofen (ADVIL,MOTRIN) 200 MG tablet Take 200 mg by mouth every 6 (six) hours as needed. Reported on 04/01/2015     ondansetron (ZOFRAN) 4 MG tablet May take 1 tablet (4 mg total) by mouth as directed 30-40 minutes before each colonoscopy prep for nausea. 2 tablet 0   Vitamin D, Cholecalciferol, 25 MCG (1000 UT) CAPS      clobetasol  cream (TEMOVATE ) 0.05 % Apply 1 application topically 2 (two) times daily. As needed for  poison ivy 30 g 0   COVID-19 mRNA bivalent vaccine, Pfizer, (PFIZER COVID-19 VAC BIVALENT) injection Inject into the muscle. 0.3 mL 0   Melatonin 5 MG TABS Take 1 tablet by mouth at bedtime as needed.     zolpidem  (AMBIEN ) 5 MG tablet Take 1 tablet (5 mg total) by mouth at bedtime as needed for sleep. 20 tablet 1   Current Facility-Administered Medications  Medication Dose Route Frequency Provider Last Rate Last Admin   0.9 %  sodium chloride  infusion  500 mL Intravenous Once Dailin Sosnowski, Gordy HERO, MD  Allergies as of 01/13/2024   (No Known Allergies)    Family History  Problem Relation Age of Onset   Hypertension Mother    Colon cancer Mother    Heart disease Father    Goiter Sister    Colon polyps Sister    Colon polyps Brother    Coronary artery disease Brother    Coronary artery disease Other    Esophageal cancer Neg Hx    Rectal cancer Neg Hx    Stomach cancer Neg Hx     Social History   Socioeconomic History   Marital status: Single    Spouse name: Not on file   Number of children: Not on file   Years of education: Not on file   Highest education level: Master's degree (e.g., MA, MS, MEng, MEd, MSW, MBA)  Occupational History   Not on file  Tobacco Use   Smoking status: Never    Smokeless tobacco: Never  Vaping Use   Vaping status: Never Used  Substance and Sexual Activity   Alcohol use: Yes    Alcohol/week: 5.0 standard drinks of alcohol    Types: 5 Cans of beer per week    Comment: 3 times a month - wine   Drug use: No   Sexual activity: Not on file  Other Topics Concern   Not on file  Social History Narrative   hh  Of 1    Pet cats   No ets   Employed: [employer]   Social Drivers of Corporate Investment Banker Strain: Low Risk  (10/19/2023)   Overall Financial Resource Strain (CARDIA)    Difficulty of Paying Living Expenses: Not hard at all  Food Insecurity: No Food Insecurity (10/19/2023)   Hunger Vital Sign    Worried About Running Out of Food in the Last Year: Never true    Ran Out of Food in the Last Year: Never true  Transportation Needs: No Transportation Needs (10/19/2023)   PRAPARE - Administrator, Civil Service (Medical): No    Lack of Transportation (Non-Medical): No  Physical Activity: Insufficiently Active (10/19/2023)   Exercise Vital Sign    Days of Exercise per Week: 2 days    Minutes of Exercise per Session: 50 min  Stress: Stress Concern Present (10/19/2023)   [employer] of Occupational Health - Occupational Stress Questionnaire    Feeling of Stress: Rather much  Social Connections: Socially Isolated (10/19/2023)   Social Connection and Isolation Panel    Frequency of Communication with Friends and Family: Once a week    Frequency of Social Gatherings with Friends and Family: Once a week    Attends Religious Services: Never    Database Administrator or Organizations: No    Attends Engineer, Structural: Not on file    Marital Status: Never married  Catering Manager Violence: Not on file    Physical Exam: Vital signs in last 24 hours: @BP  131/66   Pulse (!) 56   Temp (!) 97.3 F (36.3 C) (Temporal)   Ht 5' 6 (1.676 m)   Wt 141 lb (64 kg)   SpO2 98%   BMI 22.76 kg/m  GEN: NAD EYE: Sclerae  anicteric ENT: MMM CV: Non-tachycardic Pulm: CTA b/l GI: Soft, NT/ND NEURO:  Alert & Oriented x 3   Gordy Starch, MD [facility]  01/13/2024 1:21 PM

## 2024-01-13 NOTE — Progress Notes (Signed)
 Report to PACU, RN, vss, BBS= Clear.

## 2024-01-13 NOTE — Op Note (Signed)
 [facility] Patient Name: Peggy Harrington Procedure Date: 01/13/2024 1:23 PM MRN: [id number] Endoscopist: Gordy CHRISTELLA Starch , MD, [id number] Age: 68 Referring MD:  Date of Birth: Dec 28, 1955 Gender: Female Account #: [id number] Procedure:                Colonoscopy Indications:              High risk colon cancer surveillance: Personal                            history of multiple adenomas and SSPs, Last                            colonoscopy: December 2021 (TA x 2, SSP x 1), Feb                            2017 (SSP x 2), family history of colon cancer in                            patient's mother Medicines:                Monitored Anesthesia Care Procedure:                Pre-Anesthesia Assessment:                           - Prior to the procedure, a History and Physical                            was performed, and patient medications and                            allergies were reviewed. The patient's tolerance of                            previous anesthesia was also reviewed. The risks                            and benefits of the procedure and the sedation                            options and risks were discussed with the patient.                            All questions were answered, and informed consent                            was obtained. Prior Anticoagulants: The patient has                            taken no anticoagulant or antiplatelet agents. ASA                            Grade Assessment: II - A patient with mild systemic  disease. After reviewing the risks and benefits,                            the patient was deemed in satisfactory condition to                            undergo the procedure.                           After obtaining informed consent, the colonoscope                            was passed under direct vision. Throughout the                            procedure, the patient's blood pressure, pulse, and                             oxygen saturations were monitored continuously. The                            Olympus Scope SN [id number] was introduced through the                            anus and advanced to the cecum, identified by                            appendiceal orifice and ileocecal valve. The                            colonoscopy was performed without difficulty. The                            patient tolerated the procedure well. The quality                            of the bowel preparation was excellent. The                            ileocecal valve, appendiceal orifice, and rectum                            were photographed. Scope In: 1:33:10 PM Scope Out: 1:45:07 PM Scope Withdrawal Time: 0 hours 9 minutes 15 seconds  Total Procedure Duration: 0 hours 11 minutes 57 seconds  Findings:                 The digital rectal exam was normal.                           Multiple small-mouthed diverticula were found in                            the sigmoid colon.  The exam was otherwise without abnormality on                            direct and retroflexion views. Complications:            No immediate complications. Estimated Blood Loss:     Estimated blood loss: none. Impression:               - Mild diverticulosis in the sigmoid colon.                           - The examination was otherwise normal on direct                            and retroflexion views.                           - No specimens collected. Recommendation:           - Patient has a contact number available for                            emergencies. The signs and symptoms of potential                            delayed complications were discussed with the                            patient. Return to normal activities tomorrow.                            Written discharge instructions were provided to the                            patient.                           - Resume  previous diet.                           - Continue present medications.                           - Repeat colonoscopy in 5 years for surveillance. Gordy CHRISTELLA Starch, MD 01/13/2024 1:51:45 PM This report has been signed electronically.

## 2024-01-14 ENCOUNTER — Telehealth: Payer: Self-pay

## 2024-01-14 NOTE — Telephone Encounter (Signed)
Attempted to reach patient for post-procedure f/u call. No answer. Left message for her to please not hesitate to call if she has questions/concerns regarding her care.
